# Patient Record
Sex: Female | Born: 1955 | Race: White | Hispanic: No | Marital: Married | Smoking: Never smoker
Health system: Southern US, Community
[De-identification: ages and names within clinical notes are randomized; demographics above are authoritative.]

## PROBLEM LIST (undated history)

## (undated) DIAGNOSIS — I10 Essential (primary) hypertension: Secondary | ICD-10-CM

## (undated) DIAGNOSIS — R809 Proteinuria, unspecified: Secondary | ICD-10-CM

## (undated) DIAGNOSIS — E119 Type 2 diabetes mellitus without complications: Secondary | ICD-10-CM

## (undated) DIAGNOSIS — G63 Polyneuropathy in diseases classified elsewhere: Secondary | ICD-10-CM

## (undated) DIAGNOSIS — F411 Generalized anxiety disorder: Secondary | ICD-10-CM

## (undated) DIAGNOSIS — H25813 Combined forms of age-related cataract, bilateral: Secondary | ICD-10-CM

## (undated) DIAGNOSIS — K589 Irritable bowel syndrome without diarrhea: Secondary | ICD-10-CM

## (undated) HISTORY — PX: CHOLECYSTECTOMY: SHX55

## (undated) HISTORY — DX: Irritable bowel syndrome without diarrhea: K58.9

## (undated) HISTORY — DX: Type 2 diabetes mellitus without complications: E11.9

## (undated) HISTORY — DX: Generalized anxiety disorder: F41.1

## (undated) HISTORY — DX: Proteinuria, unspecified: R80.9

## (undated) HISTORY — PX: EYE SURGERY: SHX253

## (undated) HISTORY — DX: Essential (primary) hypertension: I10

## (undated) HISTORY — DX: Polyneuropathy in diseases classified elsewhere: G63

## (undated) HISTORY — DX: Combined forms of age-related cataract, bilateral: H25.813

---

## 2004-10-15 ENCOUNTER — Ambulatory Visit: Payer: Self-pay | Admitting: Family Medicine

## 2005-01-08 ENCOUNTER — Ambulatory Visit: Payer: Self-pay | Admitting: Gastroenterology

## 2006-04-15 ENCOUNTER — Ambulatory Visit: Payer: Self-pay | Admitting: Family Medicine

## 2006-09-08 ENCOUNTER — Ambulatory Visit: Payer: Self-pay | Admitting: Family Medicine

## 2009-12-07 ENCOUNTER — Ambulatory Visit: Payer: Self-pay | Admitting: Physician Assistant

## 2011-01-22 DIAGNOSIS — F411 Generalized anxiety disorder: Secondary | ICD-10-CM

## 2011-01-22 DIAGNOSIS — K589 Irritable bowel syndrome without diarrhea: Secondary | ICD-10-CM | POA: Insufficient documentation

## 2011-01-22 HISTORY — DX: Irritable bowel syndrome, unspecified: K58.9

## 2011-01-22 HISTORY — DX: Generalized anxiety disorder: F41.1

## 2011-12-06 ENCOUNTER — Emergency Department: Payer: Self-pay | Admitting: Emergency Medicine

## 2011-12-06 LAB — COMPREHENSIVE METABOLIC PANEL
Alkaline Phosphatase: 91 U/L (ref 50–136)
BUN: 22 mg/dL — ABNORMAL HIGH (ref 7–18)
Bilirubin,Total: 0.4 mg/dL (ref 0.2–1.0)
Calcium, Total: 9.4 mg/dL (ref 8.5–10.1)
Creatinine: 0.98 mg/dL (ref 0.60–1.30)
EGFR (African American): >60
EGFR (Non-African Amer.): >60
Glucose: 477 mg/dL — ABNORMAL HIGH (ref 65–99)
Osmolality: 294 (ref 275–301)
SGOT(AST): 18 U/L (ref 15–37)
SGPT (ALT): 17 U/L
Sodium: 135 mmol/L — ABNORMAL LOW (ref 136–145)

## 2011-12-06 LAB — CBC
HCT: 38.6 % (ref 35.0–47.0)
HGB: 12.8 g/dL (ref 12.0–16.0)
MCH: 29.2 pg (ref 26.0–34.0)
MCHC: 33.2 g/dL (ref 32.0–36.0)
MCV: 88 fL (ref 80–100)
Platelet: 211 10*3/uL (ref 150–440)
RDW: 12.6 % (ref 11.5–14.5)
WBC: 12.1 10*3/uL — ABNORMAL HIGH (ref 3.6–11.0)

## 2011-12-06 LAB — TROPONIN I: Troponin-I: <0.02 ng/mL

## 2011-12-06 LAB — LIPASE, BLOOD: Lipase: 299 U/L (ref 73–393)

## 2011-12-07 LAB — URINALYSIS, COMPLETE
Bilirubin,UR: NEGATIVE
Glucose,UR: >=500 mg/dL (ref 0–75)
Ph: 6 (ref 4.5–8.0)
Protein: NEGATIVE
RBC,UR: 3 /HPF (ref 0–5)
Specific Gravity: 1.021 (ref 1.003–1.030)
Squamous Epithelial: <1

## 2011-12-10 ENCOUNTER — Ambulatory Visit: Payer: Self-pay | Admitting: Family Medicine

## 2013-05-17 ENCOUNTER — Ambulatory Visit: Payer: Self-pay | Admitting: Family

## 2013-05-17 LAB — URINALYSIS, COMPLETE
Bacteria: NEGATIVE
Bilirubin,UR: NEGATIVE
Ketone: NEGATIVE
Leukocyte Esterase: NEGATIVE
Nitrite: NEGATIVE
Protein: NEGATIVE
Specific Gravity: 1.01 (ref 1.003–1.030)
WBC UR: NONE SEEN /HPF (ref 0–5)

## 2013-05-18 ENCOUNTER — Ambulatory Visit: Payer: Self-pay | Admitting: Family

## 2013-05-18 LAB — CBC WITH DIFFERENTIAL/PLATELET
Eosinophil #: 0.1 10*3/uL (ref 0.0–0.7)
Eosinophil %: 2.7 %
HCT: 35.9 % (ref 35.0–47.0)
HGB: 11.9 g/dL — ABNORMAL LOW (ref 12.0–16.0)
Lymphocyte #: 2.2 10*3/uL (ref 1.0–3.6)
Lymphocyte %: 40.7 %
MCH: 29.2 pg (ref 26.0–34.0)
Monocyte #: 0.4 x10 3/mm (ref 0.2–0.9)
Monocyte %: 7.3 %
Neutrophil #: 2.6 10*3/uL (ref 1.4–6.5)
Neutrophil %: 48.5 %
Platelet: 223 10*3/uL (ref 150–440)
RBC: 4.07 10*6/uL (ref 3.80–5.20)

## 2013-05-18 LAB — HEPATIC FUNCTION PANEL A (ARMC)
Bilirubin,Total: 0.3 mg/dL (ref 0.2–1.0)
Total Protein: 7.4 g/dL (ref 6.4–8.2)

## 2013-05-18 LAB — BASIC METABOLIC PANEL
BUN: 14 mg/dL (ref 7–18)
Calcium, Total: 8.9 mg/dL (ref 8.5–10.1)
Chloride: 100 mmol/L (ref 98–107)
Co2: 29 mmol/L (ref 21–32)
Creatinine: 0.75 mg/dL (ref 0.60–1.30)
EGFR (Non-African Amer.): >60
Potassium: 4.1 mmol/L (ref 3.5–5.1)

## 2013-05-18 LAB — TSH: Thyroid Stimulating Horm: 6.89 u[IU]/mL — ABNORMAL HIGH

## 2013-05-18 LAB — SEDIMENTATION RATE: Erythrocyte Sed Rate: 31 mm/hr — ABNORMAL HIGH (ref 0–30)

## 2013-05-23 LAB — CLOSTRIDIUM DIFFICILE(ARMC)

## 2013-05-25 LAB — STOOL CULTURE

## 2013-08-30 DIAGNOSIS — I10 Essential (primary) hypertension: Secondary | ICD-10-CM | POA: Insufficient documentation

## 2013-08-30 HISTORY — DX: Essential (primary) hypertension: I10

## 2014-03-30 DIAGNOSIS — H25813 Combined forms of age-related cataract, bilateral: Secondary | ICD-10-CM | POA: Insufficient documentation

## 2014-03-30 HISTORY — DX: Combined forms of age-related cataract, bilateral: H25.813

## 2015-02-16 ENCOUNTER — Encounter: Payer: Self-pay | Admitting: *Deleted

## 2015-02-16 ENCOUNTER — Ambulatory Visit
Admission: EM | Admit: 2015-02-16 | Discharge: 2015-02-16 | Disposition: A | Discharge status: Home / Self Care | Payer: BC Managed Care – PPO | Attending: Internal Medicine | Admitting: Internal Medicine

## 2015-02-16 DIAGNOSIS — J4531 Mild persistent asthma with (acute) exacerbation: Secondary | ICD-10-CM

## 2015-02-16 HISTORY — DX: Type 2 diabetes mellitus without complications: E11.9

## 2015-02-16 HISTORY — DX: Essential (primary) hypertension: I10

## 2015-02-16 MED ORDER — BENZONATATE 200 MG PO CAPS: 200.0000 mg | ORAL_CAPSULE | Freq: Three times a day (TID) | ORAL | Status: DC | PRN

## 2015-02-16 MED ORDER — PREDNISONE 50 MG PO TABS: 50.0000 mg | ORAL_TABLET | Freq: Every day | ORAL | Status: DC

## 2015-02-16 MED ORDER — ALBUTEROL SULFATE HFA 108 (90 BASE) MCG/ACT IN AERS: 1.0000 | INHALATION_SPRAY | Freq: Four times a day (QID) | RESPIRATORY_TRACT | Status: DC | PRN

## 2015-02-16 NOTE — Discharge Instructions (Signed)
Sinus drainage can contribute; zyrtec or claritin otc may be helpful, as may a nasal steroid spray (nasacort, flonase).  Prescriptions for albuterol inhaler (to help calm airways), tessalon perles (to try to help with cough) and a burst of prednisone (3 days), were sent to the [facility] in [place].  Bronchospasm A bronchospasm is a spasm or tightening of the airways going into the lungs. During a bronchospasm breathing becomes more difficult because the airways get smaller. When this happens there can be coughing, a whistling sound when breathing (wheezing), and difficulty breathing. Bronchospasm is often associated with asthma, but not all patients who experience a bronchospasm have asthma. CAUSES  A bronchospasm is caused by inflammation or irritation of the airways. The inflammation or irritation may be triggered by:   Allergies (such as to animals, pollen, food, or mold). Allergens that cause bronchospasm may cause wheezing immediately after exposure or many hours later.   Infection. Viral infections are believed to be the most common cause of bronchospasm.   Exercise.   Irritants (such as pollution, cigarette smoke, strong odors, aerosol sprays, and paint fumes).   Weather changes. Winds increase molds and pollens in the air. Rain refreshes the air by washing irritants out. Cold air may cause inflammation.   Stress and emotional upset.  SIGNS AND SYMPTOMS   Wheezing.   Excessive nighttime coughing.   Frequent or severe coughing with a simple cold.   Chest tightness.   Shortness of breath.  DIAGNOSIS  Bronchospasm is usually diagnosed through a history and physical exam. Tests, such as chest X-rays, are sometimes done to look for other conditions. TREATMENT   Inhaled medicines can be given to open up your airways and help you breathe. The medicines can be given using either an inhaler or a nebulizer machine.  Corticosteroid medicines may be given for severe  bronchospasm, usually when it is associated with asthma. HOME CARE INSTRUCTIONS   Always have a plan prepared for seeking medical care. Know when to call your health care provider and local emergency services (911 in the [place] Know where you can access local emergency care.  Only take medicines as directed by your health care provider.  If you were prescribed an inhaler or nebulizer machine, ask your health care provider to explain how to use it correctly. Always use a spacer with your inhaler if you were given one.  It is necessary to remain calm during an attack. Try to relax and breathe more slowly.  Control your home environment in the following ways:   Change your heating and air conditioning filter at least once a month.   Limit your use of fireplaces and wood stoves.  Do not smoke and do not allow smoking in your home.   Avoid exposure to perfumes and fragrances.   Get rid of pests (such as roaches and mice) and their droppings.   Throw away plants if you see mold on them.   Keep your house clean and dust free.   Replace carpet with wood, tile, or vinyl flooring. Carpet can trap dander and dust.   Use allergy-proof pillows, mattress covers, and box spring covers.   Wash bed sheets and blankets every week in hot water and dry them in a dryer.   Use blankets that are made of polyester or cotton.   Wash hands frequently. SEEK MEDICAL CARE IF:   You have muscle aches.   You have chest pain.   The sputum changes from clear or white to yellow, green,  gray, or bloody.   The sputum you cough up gets thicker.   There are problems that may be related to the medicine you are given, such as a rash, itching, swelling, or trouble breathing.  SEEK IMMEDIATE MEDICAL CARE IF:   You have worsening wheezing and coughing even after taking your prescribed medicines.   You have increased difficulty breathing.   You develop severe chest pain. MAKE SURE YOU:     Understand these instructions.  Will watch your condition.  Will get help right away if you are not doing well or get worse. Document Released: 05/30/2003 Document Revised: 06/01/2013 Document Reviewed: 11/16/2012 [facility] Patient Information 2015 [place], [place]. This information is not intended to replace advice given to you by your health care provider. Make sure you discuss any questions you have with your health care provider.

## 2015-02-16 NOTE — ED Notes (Signed)
Pt states that she has had a cough for a couple of weeks and is having leg swelling in the left ankle.

## 2015-02-16 NOTE — ED Provider Notes (Signed)
CSN: [id number]     Arrival date & time 02/16/15  1727 History   First MD Initiated Contact with Patient 02/16/15 1906     Chief Complaint  Patient presents with  . Cough  . Leg Swelling   Patient is a 59 y.o. female presenting with cough.  Cough  patient is been coughing for a couple weeks, was a little worse about a week ago, now is better again but has not completely subsided. Mostly dry. Has a chronic runny nose, hasn't really noticed any change in this. No sore throat. No fever. 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 No difficulty with indigestion, reflux, sour stomach. Headache is chronic, no real change. In the last several weeks, feels like she has had slightly asymmetric leg swelling at the end of the day, usually feels like it is symmetric, not seems to be slightly worse on the left. Subsides overnight, when she is in bed, but then re-accumulates if she is up and around the next day.  Past Medical History  Diagnosis Date  . Diabetes mellitus without complication   . Hypertension    Past Surgical History  Procedure Laterality Date  . Cholecystectomy    . Eye surgery     Family history: No venous thromboembolic disease Social History  Substance Use Topics  . Smoking status: Never Smoker   . Smokeless tobacco: None  . Alcohol Use: No    Review of Systems  Respiratory: Positive for cough.   All other systems reviewed and are negative.   Allergies  Review of patient's allergies indicates no known allergies.  Home Medications   Prior to Admission medications   Medication Sig Start Date End Date Taking? Authorizing Provider  gabapentin (NEURONTIN) 100 MG capsule Take 100 mg by mouth 3 (three) times daily.   Yes Historical Provider, MD  insulin glargine (LANTUS) 100 UNIT/ML injection Inject 28 Units into the skin at bedtime.   Yes Historical Provider, MD  lisinopril (PRINIVIL,ZESTRIL) 10 MG tablet Take 10 mg by mouth daily.   Yes Historical Provider,  MD  lisinopril (PRINIVIL,ZESTRIL) 20 MG tablet Take 20 mg by mouth daily.   Yes Historical Provider, MD  metFORMIN (GLUCOPHAGE) 500 MG tablet Take 500 mg by mouth daily with breakfast.   Yes Historical Provider, MD                         BP 183/63 mmHg  Pulse 88  Temp(Src) 98.1 F (36.7 C) (Oral)  Ht 5\' 2"  (1.575 m)  Wt 155 lb (70.308 kg)  BMI 28.34 kg/m2  SpO2 98%   Physical Exam  Constitutional: She is oriented to person, place, and time. No distress.  Alert, nicely groomed  HENT:  Head: Atraumatic.  Bilateral tympanic membranes are moderately dull, no erythema Marked nasal congestion bilaterally, essentially occluded Throat is slightly red, voice is a little scratchy  Eyes:  Conjugate gaze, no eye redness/drainage  Neck: Neck supple.  Cardiovascular: Normal rate and regular rhythm.   Pulmonary/Chest: No respiratory distress. She has no wheezes. She has no rales.  Lungs clear, symmetric breath sounds Coughs with deep breath; patient has several coughing spells during the exam.  Abdominal: She exhibits no distension.  Musculoskeletal:  Distal foot and left ankle, around the straps of her sandal, are slightly puffy. Measurements are as follows: Left calf 15.5 inches, right calf 15.25 inches. Left ankle 10 inches, right ankle 10 inches.  Neurological: She is alert and  oriented to person, place, and time.  Skin: Skin is warm and dry.  Pink. Maybe slightly pale. No cyanosis  Nursing note and vitals reviewed.   ED Course  Procedures none  MDM   1. Bronchitis, allergic, mild persistent, with acute exacerbation    Discharge Medication List as of 02/16/2015  7:24 PM    START taking these medications   Details  albuterol (PROVENTIL HFA;VENTOLIN HFA) 108 (90 BASE) MCG/ACT inhaler Inhale 1-2 puffs into the lungs every 6 (six) hours as needed for wheezing or shortness of breath., Starting 02/16/2015, Until Discontinued, Normal    benzonatate (TESSALON) 200 MG capsule Take 1  capsule (200 mg total) by mouth 3 (three) times daily as needed for cough., Starting 02/16/2015, Until Discontinued, Normal    predniSONE (DELTASONE) 50 MG tablet Take 1 tablet (50 mg total) by mouth daily., Starting 02/16/2015, Until Discontinued, Normal       Recheck if not improving in several days, or for new temperature greater than 100.5. Anticipate gradual improvement in cough over the next 2-3 weeks.   Sherlene Shams, MD 02/16/15 2033

## 2015-08-15 DIAGNOSIS — E119 Type 2 diabetes mellitus without complications: Secondary | ICD-10-CM | POA: Insufficient documentation

## 2015-08-15 DIAGNOSIS — G63 Polyneuropathy in diseases classified elsewhere: Secondary | ICD-10-CM

## 2015-08-15 DIAGNOSIS — R809 Proteinuria, unspecified: Secondary | ICD-10-CM | POA: Insufficient documentation

## 2015-08-15 HISTORY — DX: Proteinuria, unspecified: R80.9

## 2015-08-15 HISTORY — DX: Type 2 diabetes mellitus without complications: E11.9

## 2015-08-15 HISTORY — DX: Polyneuropathy in diseases classified elsewhere: G63

## 2016-02-08 ENCOUNTER — Ambulatory Visit
Admission: EM | Admit: 2016-02-08 | Discharge: 2016-02-08 | Disposition: A | Discharge status: Home / Self Care | Payer: BC Managed Care – PPO | Attending: Family Medicine | Admitting: Family Medicine

## 2016-02-08 DIAGNOSIS — S161XXA Strain of muscle, fascia and tendon at neck level, initial encounter: Secondary | ICD-10-CM | POA: Diagnosis not present

## 2016-02-08 DIAGNOSIS — R42 Dizziness and giddiness: Secondary | ICD-10-CM | POA: Diagnosis not present
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
## 2016-03-07 ENCOUNTER — Other Ambulatory Visit: Payer: Self-pay | Admitting: Family Medicine

## 2016-03-07 DIAGNOSIS — N6320 Unspecified lump in the left breast, unspecified quadrant: Principal | ICD-10-CM

## 2016-03-07 DIAGNOSIS — Z1231 Encounter for screening mammogram for malignant neoplasm of breast: Secondary | ICD-10-CM

## 2016-03-07 DIAGNOSIS — N6310 Unspecified lump in the right breast, unspecified quadrant: Secondary | ICD-10-CM

## 2016-03-19 ENCOUNTER — Ambulatory Visit
Admission: RE | Admit: 2016-03-19 | Discharge: 2016-03-19 | Disposition: A | Discharge status: Home / Self Care | Payer: BC Managed Care – PPO | Source: Ambulatory Visit | Attending: Family Medicine | Admitting: Family Medicine

## 2016-03-19 ENCOUNTER — Encounter: Payer: Self-pay | Admitting: Radiology

## 2016-03-19 DIAGNOSIS — N6320 Unspecified lump in the left breast, unspecified quadrant: Principal | ICD-10-CM

## 2016-03-19 DIAGNOSIS — N6310 Unspecified lump in the right breast, unspecified quadrant: Secondary | ICD-10-CM | POA: Insufficient documentation

## 2016-07-09 ENCOUNTER — Ambulatory Visit (INDEPENDENT_AMBULATORY_CARE_PROVIDER_SITE_OTHER): Payer: BC Managed Care – PPO | Admitting: Gastroenterology

## 2016-07-09 ENCOUNTER — Other Ambulatory Visit: Payer: Self-pay

## 2016-07-09 ENCOUNTER — Encounter: Payer: Self-pay | Admitting: Gastroenterology

## 2016-07-09 VITALS — BP 155/74 | HR 86 | Temp 99.1°F | Ht 62.0 in | Wt 150.0 lb

## 2016-07-09 DIAGNOSIS — R197 Diarrhea, unspecified: Secondary | ICD-10-CM | POA: Diagnosis not present

## 2016-07-09 DIAGNOSIS — R1084 Generalized abdominal pain: Secondary | ICD-10-CM | POA: Diagnosis not present

## 2016-07-09 NOTE — Progress Notes (Signed)
Gastroenterology Consultation  Referring Provider:     Rudi Coco, MD Primary Care Physician:  WHITE, Jarome Matin, MD Primary Gastroenterologist:  Dr. Allen Norris     Reason for Consultation:     Diarrhea        HPI:   Kim Stevens is a 61 y.o. y/o female referred for consultation & management of Diarrhea by Dr. Dema Severin, Jarome Matin, MD.  This patient comes in today with a report of many years of diarrhea. The patient reports that about 5 years ago she was admitted to the hospital and found to have colitis. The patient reports the colitis was told her to possibly be an infection because she presented with nausea vomiting or rectal bleeding. The patient has no further rectal bleeding but she has intermittent diarrhea which happens every week to every other week. The patient states the diarrhea is so bad that she has soiled herself while sleeping. She has also fallen out of bed while trying to rush to the bathroom. The patient states that she is reluctant to go to the beach or any place where she is not close to a bathroom. This has resulted in her not going out to dinner with her family. The patient also reports that she has generalized abdominal pain. When the diarrhea is not present the patient states that her stools are soft and pasty but she does not have overt constipation. She does not have a bowel movement for 4-5 days after these bouts of diarrhea. She also states that she has lost approximate 5 pounds when this happens. The patient has a family history of colon cancer in her mother at a young age and she thinks her mother was around 48 years old. The patient thinks her last colonoscopy was 4 years ago.  Past Medical History:  Diagnosis Date  . Anxiety state 01/22/2011  . Combined forms of age-related cataract of both eyes 03/30/2014  . Diabetes mellitus without complication ([place])   . HTN (hypertension) 08/30/2013  . Hypertension   . Irritable bowel syndrome 01/22/2011  . Microalbuminuria  08/15/2015  . Polyneuropathy associated with underlying disease ([place]) 08/15/2015   Overview:  Gabapentin qhs  . Type 2 diabetes mellitus not at goal [facility]) 08/15/2015   Overview:  Can no longer go to [facility]. Referred to [facility] 3/17    Past Surgical History:  Procedure Laterality Date  . CHOLECYSTECTOMY    . EYE SURGERY      Prior to Admission medications   Medication Sig Start Date End Date Taking? Authorizing Provider  insulin glargine (LANTUS) 100 UNIT/ML injection Inject 28 Units into the skin at bedtime.   Yes Historical Provider, MD  albuterol (PROVENTIL HFA;VENTOLIN HFA) 108 (90 BASE) MCG/ACT inhaler Inhale 1-2 puffs into the lungs every 6 (six) hours as needed for wheezing or shortness of breath. Patient not taking: Reported on 07/09/2016 02/16/15   Sherlene Shams, MD  amLODipine (NORVASC) 10 MG tablet Take by mouth. 08/22/15 08/21/16  Historical Provider, MD  aspirin 81 MG chewable tablet Chew by mouth.    Historical Provider, MD  atorvastatin (LIPITOR) 40 MG tablet Take by mouth. 08/15/15 08/14/16  Historical Provider, MD  benzonatate (TESSALON) 200 MG capsule Take 1 capsule (200 mg total) by mouth 3 (three) times daily as needed for cough. Patient not taking: Reported on 07/09/2016 02/16/15   Sherlene Shams, MD  Blood Glucose Monitoring Suppl (FIFTY50 GLUCOSE METER 2.0) w/Device KIT Use as directed. SN #[id number], LOT #-[id number] X  and Exp-08/2015 06/21/14   Historical Provider, MD  gabapentin (NEURONTIN) 100 MG capsule Take 300 mg by mouth 2 (two) times daily.     Historical Provider, MD  insulin regular (NOVOLIN R,HUMULIN R) 250 units/2.18m (100 units/mL) injection Inject into the skin. 08/22/15   Historical Provider, MD  lisinopril (PRINIVIL,ZESTRIL) 10 MG tablet Take 10 mg by mouth daily.    Historical Provider, MD  lisinopril (PRINIVIL,ZESTRIL) 20 MG tablet Take 20 mg by mouth daily.    Historical Provider, MD  lisinopril-hydrochlorothiazide (PRINZIDE,ZESTORETIC) 20-25 MG tablet Take by  mouth. 08/15/15 08/14/16  Historical Provider, MD  metFORMIN (GLUCOPHAGE) 500 MG tablet Take 500 mg by mouth daily with breakfast.    Historical Provider, MD  nepafenac (NEVANAC) 0.1 % ophthalmic suspension  12/08/14   Historical Provider, MD  predniSONE (DELTASONE) 50 MG tablet Take 1 tablet (50 mg total) by mouth daily. Patient not taking: Reported on 07/09/2016 02/16/15   LSherlene Shams MD    Family History  Problem Relation Age of Onset  . Cancer Mother   . Diabetes Father   . Stroke Maternal Grandmother      Social History  Substance Use Topics  . Smoking status: Never Smoker  . Smokeless tobacco: Never Used  . Alcohol use Yes     Comment: occasionally    Allergies as of 07/09/2016 - Review Complete 07/09/2016  Allergen Reaction Noted  . Meperidine  12/16/2011  . Oxycodone-acetaminophen  04/15/2011    Review of Systems:    All systems reviewed and negative except where noted in HPI.   Physical Exam:  BP (!) 155/74   Pulse 86   Temp 99.1 F (37.3 C) (Oral)   Ht 5' 2"  (1.575 m)   Wt 150 lb (68 kg)   BMI 27.44 kg/m  No LMP recorded. Patient is postmenopausal. Psych:  Alert and cooperative. Normal mood and affect. General:   Alert,  Well-developed, well-nourished, pleasant and cooperative in NAD Head:  Normocephalic and atraumatic. Eyes:  Sclera clear, no icterus.   Conjunctiva pink. Ears:  Normal auditory acuity. Nose:  No deformity, discharge, or lesions. Mouth:  No deformity or lesions,oropharynx pink & moist. Neck:  Supple; no masses or thyromegaly. Lungs:  Respirations even and unlabored.  Clear throughout to auscultation.   No wheezes, crackles, or rhonchi. No acute distress. Heart:  Regular rate and rhythm; no murmurs, clicks, rubs, or gallops. Abdomen:  Normal bowel sounds.  No bruits.  Soft, non-tender and non-distended without masses, hepatosplenomegaly or hernias noted.  No guarding or rebound tenderness.  Negative Carnett sign.   Rectal:  Deferred.  Msk:   Symmetrical without gross deformities.  Good, equal movement & strength bilaterally. Pulses:  Normal pulses noted. Extremities:  No clubbing or edema.  No cyanosis. Neurologic:  Alert and oriented x3;  grossly normal neurologically. Skin:  Intact without significant lesions or rashes.  No jaundice. Lymph Nodes:  No significant cervical adenopathy. Psych:  Alert and cooperative. Normal mood and affect.  Imaging Studies: No results found.  Assessment and Plan:   Kim WILLISONis a 61y.o. y/o female who has a history of diarrhea but it is now getting worse with the patient soiling herself at night. The patient also reports some intermittent weight loss when she has the symptoms. The patient will be set up for colonoscopy for possible microscopic colitis. She also states that she has been tested for celiac sprue in the past and states that she was told she may be  sensitive to wheat. The patient reports that she has avoided it for some time. The patient also reports that she eats cheese and drinks very little if any at all. The patient will try to see if her symptoms are worse when she has dairy products. The patient has been explained the plan and agrees with it.    Lucilla Lame, MD. Marval Regal   Note: This dictation was prepared with Dragon dictation along with smaller phrase technology. Any transcriptional errors that result from this process are unintentional.

## 2016-07-11 ENCOUNTER — Encounter: Payer: Self-pay | Admitting: *Deleted

## 2016-07-12 ENCOUNTER — Telehealth: Payer: Self-pay | Admitting: Gastroenterology

## 2016-07-12 ENCOUNTER — Other Ambulatory Visit: Payer: Self-pay

## 2016-07-12 MED ORDER — PEG 3350-KCL-NABCB-NACL-NASULF 236 G PO SOLR: ORAL | 0 refills | Status: DC

## 2016-07-12 NOTE — Telephone Encounter (Signed)
Patients colonoscopy is Monday and her RX for supprep hasn't been called in yet [facility]

## 2016-07-12 NOTE — Telephone Encounter (Signed)
Rx sent to [facility] pharmacy.  

## 2016-07-15 ENCOUNTER — Ambulatory Visit: Payer: BC Managed Care – PPO | Admitting: Anesthesiology

## 2016-07-15 ENCOUNTER — Encounter
Admission: RE | Disposition: A | Discharge status: Home / Self Care | Payer: Self-pay | Source: Ambulatory Visit | Attending: Gastroenterology

## 2016-07-15 ENCOUNTER — Ambulatory Visit
Admission: RE | Admit: 2016-07-15 | Discharge: 2016-07-15 | Disposition: A | Discharge status: Home / Self Care | Payer: BC Managed Care – PPO | Source: Ambulatory Visit | Attending: Gastroenterology | Admitting: Gastroenterology

## 2016-07-15 DIAGNOSIS — D123 Benign neoplasm of transverse colon: Secondary | ICD-10-CM | POA: Diagnosis not present

## 2016-07-15 DIAGNOSIS — Z794 Long term (current) use of insulin: Secondary | ICD-10-CM | POA: Insufficient documentation

## 2016-07-15 DIAGNOSIS — I10 Essential (primary) hypertension: Secondary | ICD-10-CM | POA: Diagnosis not present

## 2016-07-15 DIAGNOSIS — E119 Type 2 diabetes mellitus without complications: Secondary | ICD-10-CM | POA: Diagnosis not present

## 2016-07-15 DIAGNOSIS — Z79899 Other long term (current) drug therapy: Secondary | ICD-10-CM | POA: Insufficient documentation

## 2016-07-15 DIAGNOSIS — K640 First degree hemorrhoids: Secondary | ICD-10-CM | POA: Insufficient documentation

## 2016-07-15 DIAGNOSIS — G629 Polyneuropathy, unspecified: Secondary | ICD-10-CM | POA: Insufficient documentation

## 2016-07-15 DIAGNOSIS — K529 Noninfective gastroenteritis and colitis, unspecified: Secondary | ICD-10-CM | POA: Insufficient documentation

## 2016-07-15 DIAGNOSIS — F419 Anxiety disorder, unspecified: Secondary | ICD-10-CM | POA: Diagnosis not present

## 2016-07-15 DIAGNOSIS — D122 Benign neoplasm of ascending colon: Secondary | ICD-10-CM | POA: Diagnosis not present

## 2016-07-15 HISTORY — PX: POLYPECTOMY: SHX5525

## 2016-07-15 HISTORY — PX: COLONOSCOPY WITH PROPOFOL: SHX5780

## 2016-07-15 SURGERY — COLONOSCOPY WITH PROPOFOL
Anesthesia: Monitor Anesthesia Care | Wound class: Contaminated

## 2016-07-15 MED ORDER — LACTATED RINGERS IV SOLN
INTRAVENOUS | Status: DC
  Administered 2016-07-15: 10:00:00 via INTRAVENOUS

## 2016-07-15 MED ORDER — LIDOCAINE HCL (CARDIAC) 20 MG/ML IV SOLN
INTRAVENOUS | Status: DC | PRN
  Administered 2016-07-15: 20 mg via INTRAVENOUS

## 2016-07-15 MED ORDER — SIMETHICONE 40 MG/0.6ML PO SUSP
ORAL | Status: DC | PRN
  Administered 2016-07-15: 10:00:00

## 2016-07-15 MED ORDER — PROPOFOL 10 MG/ML IV BOLUS
INTRAVENOUS | Status: DC | PRN
  Administered 2016-07-15: 60 mg via INTRAVENOUS
  Administered 2016-07-15 (×2): 30 mg via INTRAVENOUS
  Administered 2016-07-15: 60 mg via INTRAVENOUS
  Administered 2016-07-15: 30 mg via INTRAVENOUS
  Administered 2016-07-15: 60 mg via INTRAVENOUS
  Administered 2016-07-15: 20 mg via INTRAVENOUS

## 2016-07-15 SURGICAL SUPPLY — 23 items
CANISTER SUCT 1200ML W/VALVE (MISCELLANEOUS) ×3 IMPLANT
CLIP HMST 235XBRD CATH ROT (MISCELLANEOUS) IMPLANT
CLIP RESOLUTION 360 11X235 (MISCELLANEOUS)
FCP ESCP3.2XJMB 240X2.8X (MISCELLANEOUS)
FORCEPS BIOP RAD 4 LRG CAP 4 (CUTTING FORCEPS) ×3 IMPLANT
FORCEPS BIOP RJ4 240 W/NDL (MISCELLANEOUS)
FORCEPS ESCP3.2XJMB 240X2.8X (MISCELLANEOUS) IMPLANT
GOWN CVR UNV OPN BCK APRN NK (MISCELLANEOUS) ×4 IMPLANT
GOWN ISOL THUMB LOOP REG UNIV (MISCELLANEOUS) ×2
INJECTOR VARIJECT VIN23 (MISCELLANEOUS) IMPLANT
KIT DEFENDO VALVE AND CONN (KITS) IMPLANT
KIT ENDO PROCEDURE OLY (KITS) ×3 IMPLANT
MARKER SPOT ENDO TATTOO 5ML (MISCELLANEOUS) IMPLANT
PAD GROUND ADULT SPLIT (MISCELLANEOUS) IMPLANT
PROBE APC STR FIRE (PROBE) IMPLANT
RETRIEVER NET ROTH 2.5X230 LF (MISCELLANEOUS) IMPLANT
SNARE SHORT THROW 13M SML OVAL (MISCELLANEOUS) ×3 IMPLANT
SNARE SHORT THROW 30M LRG OVAL (MISCELLANEOUS) IMPLANT
SNARE SNG USE RND 15MM (INSTRUMENTS) IMPLANT
SPOT EX ENDOSCOPIC TATTOO (MISCELLANEOUS)
TRAP ETRAP POLY (MISCELLANEOUS) ×3 IMPLANT
VARIJECT INJECTOR VIN23 (MISCELLANEOUS)
WATER STERILE IRR 250ML POUR (IV SOLUTION) ×3 IMPLANT

## 2016-07-15 NOTE — Anesthesia Procedure Notes (Signed)
Procedure Name: MAC Performed by: Lind Guest Pre-anesthesia Checklist: Patient identified, Emergency Drugs available, Suction available, Patient being monitored and Timeout performed Patient Re-evaluated:Patient Re-evaluated prior to inductionOxygen Delivery Method: Nasal cannula

## 2016-07-15 NOTE — Anesthesia Postprocedure Evaluation (Signed)
Anesthesia Post Note  Patient: Kim Stevens  Procedure(s) Performed: Procedure(s) (LRB): COLONOSCOPY WITH PROPOFOL (N/A) POLYPECTOMY  Patient location during evaluation: PACU Anesthesia Type: MAC Level of consciousness: awake and alert Pain management: pain level controlled Vital Signs Assessment: post-procedure vital signs reviewed and stable Respiratory status: spontaneous breathing, nonlabored ventilation, respiratory function stable and patient connected to nasal cannula oxygen Cardiovascular status: stable and blood pressure returned to baseline Anesthetic complications: no    Alisa Graff

## 2016-07-15 NOTE — Op Note (Signed)
[facility] Gastroenterology Patient Name: Kim Stevens Procedure Date: 07/15/2016 9:58 AM MRN: [id number] Account #: [id number] Date of Birth: [birth year] Admit Type: Outpatient Age: 61 Room: [facility] OR ROOM 01 Gender: Female Note Status: Finalized Procedure:            Colonoscopy Indications:          Chronic diarrhea Providers:            Lucilla Lame MD, MD Referring MD:         Dani Gobble. White, MD (Referring MD) Medicines:            Propofol per Anesthesia Complications:        No immediate complications. Procedure:            Pre-Anesthesia Assessment:                       - Prior to the procedure, a History and Physical was                        performed, and patient medications and allergies were                        reviewed. The patient's tolerance of previous                        anesthesia was also reviewed. The risks and benefits of                        the procedure and the sedation options and risks were                        discussed with the patient. All questions were                        answered, and informed consent was obtained. Prior                        Anticoagulants: The patient has taken no previous                        anticoagulant or antiplatelet agents. ASA Grade                        Assessment: II - A patient with mild systemic disease.                        After reviewing the risks and benefits, the patient was                        deemed in satisfactory condition to undergo the                        procedure.                       After obtaining informed consent, the colonoscope was                        passed under direct vision. Throughout the procedure,  the patient's blood pressure, pulse, and oxygen                        saturations were monitored continuously. The [place] (S#: [id number]) was introduced through   the anus and advanced to the the terminal ileum. The                        colonoscopy was performed without difficulty. The                        patient tolerated the procedure well. The quality of                        the bowel preparation was good. Findings:      The perianal and digital rectal examinations were normal.      A 8 mm polyp was found in the transverse colon. The polyp was sessile.       The polyp was removed with a cold snare. Resection and retrieval were       complete.      A 4 mm polyp was found in the ascending colon. The polyp was sessile.       The polyp was removed with a cold biopsy forceps. Resection and       retrieval were complete.      The terminal ileum appeared normal. Biopsies were taken with a cold       forceps for histology.      Random biopsies were obtained with cold forceps for histology randomly       in the entire colon.      Non-bleeding internal hemorrhoids were found during retroflexion. The       hemorrhoids were Grade I (internal hemorrhoids that do not prolapse). Impression:           - One 8 mm polyp in the transverse colon, removed with                        a cold snare. Resected and retrieved.                       - One 4 mm polyp in the ascending colon, removed with a                        cold biopsy forceps. Resected and retrieved.                       - The examined portion of the ileum was normal.                        Biopsied.                       - Non-bleeding internal hemorrhoids.                       - Random biopsies were obtained in the entire colon. Recommendation:       - Discharge patient to home.                       -  Resume previous diet.                       - Continue present medications.                       - Repeat colonoscopy in 5 years if polyp adenoma and 10                        years if hyperplastic Procedure Code(s):    --- Professional ---                       [phone], Colonoscopy,  flexible; with removal of tumor(s),                        polyp(s), or other lesion(s) by snare technique                       45380, 56, Colonoscopy, flexible; with biopsy, single                        or multiple Diagnosis Code(s):    --- Professional ---                       K52.9, Noninfective gastroenteritis and colitis,                        unspecified                       D12.3, Benign neoplasm of transverse colon (hepatic                        flexure or splenic flexure)                       D12.2, Benign neoplasm of ascending colon CPT copyright 2016 American Medical Association. All rights reserved. The codes documented in this report are preliminary and upon coder review may  be revised to meet current compliance requirements. Lucilla Lame MD, MD 07/15/2016 10:25:37 AM This report has been signed electronically. Number of Addenda: 0 Note Initiated On: 07/15/2016 9:58 AM Scope Withdrawal Time: 0 hours 10 minutes 47 seconds  Total Procedure Duration: 0 hours 15 minutes 11 seconds       [facility]

## 2016-07-15 NOTE — H&P (Signed)
Lucilla Lame, MD [address] [place], [place] [address] Phone: [phone] Fax : [phone]  Primary Care Physician:  WHITE, Jarome Matin, MD Primary Gastroenterologist:  Dr. Allen Norris  Pre-Procedure History & Physical: HPI:  Kim Stevens is a 61 y.o. female is here for an colonoscopy.   Past Medical History:  Diagnosis Date  . Anxiety state 01/22/2011  . Combined forms of age-related cataract of both eyes 03/30/2014  . Diabetes mellitus without complication ([place])   . HTN (hypertension) 08/30/2013  . Hypertension   . Irritable bowel syndrome 01/22/2011  . Microalbuminuria 08/15/2015  . Polyneuropathy associated with underlying disease ([place]) 08/15/2015   Overview:  Gabapentin qhs  . Type 2 diabetes mellitus not at goal [facility]) 08/15/2015   Overview:  Can no longer go to [facility]. Referred to [facility] 3/17    Past Surgical History:  Procedure Laterality Date  . CHOLECYSTECTOMY    . EYE SURGERY      Prior to Admission medications   Medication Sig Start Date End Date Taking? Authorizing Provider  Blood Glucose Monitoring Suppl (FIFTY50 GLUCOSE METER 2.0) w/Device KIT Use as directed. SN #[id number], LOT #-[id number] X and Exp-08/2015 06/21/14  Yes Historical Provider, MD  gabapentin (NEURONTIN) 100 MG capsule Take 300 mg by mouth 2 (two) times daily.    Yes Historical Provider, MD  insulin glargine (LANTUS) 100 UNIT/ML injection Inject 28 Units into the skin at bedtime.   Yes Historical Provider, MD  insulin regular (NOVOLIN R,HUMULIN R) 250 units/2.74m (100 units/mL) injection Inject into the skin. 08/22/15  Yes Historical Provider, MD  polyethylene glycol (GOLYTELY) 236 g solution Drink one 8 oz glass every 20 mins until stools are clear 07/12/16  Yes DLucilla Lame MD  albuterol (PROVENTIL HFA;VENTOLIN HFA) 108 (90 BASE) MCG/ACT inhaler Inhale 1-2 puffs into the lungs every 6 (six) hours as needed for wheezing or shortness of breath. Patient not taking: Reported on 07/09/2016 02/16/15    LSherlene Shams MD  amLODipine (NORVASC) 10 MG tablet Take by mouth. 08/22/15 08/21/16  Historical Provider, MD  aspirin 81 MG chewable tablet Chew by mouth.    Historical Provider, MD  atorvastatin (LIPITOR) 40 MG tablet Take by mouth. 08/15/15 08/14/16  Historical Provider, MD  benzonatate (TESSALON) 200 MG capsule Take 1 capsule (200 mg total) by mouth 3 (three) times daily as needed for cough. Patient not taking: Reported on 07/09/2016 02/16/15   LSherlene Shams MD  lisinopril (PRINIVIL,ZESTRIL) 10 MG tablet Take 10 mg by mouth daily.    Historical Provider, MD  lisinopril (PRINIVIL,ZESTRIL) 20 MG tablet Take 20 mg by mouth daily.    Historical Provider, MD  lisinopril-hydrochlorothiazide (PRINZIDE,ZESTORETIC) 20-25 MG tablet Take by mouth. 08/15/15 08/14/16  Historical Provider, MD  metFORMIN (GLUCOPHAGE) 500 MG tablet Take 500 mg by mouth daily with breakfast.    Historical Provider, MD  nepafenac (NEVANAC) 0.1 % ophthalmic suspension  12/08/14   Historical Provider, MD  predniSONE (DELTASONE) 50 MG tablet Take 1 tablet (50 mg total) by mouth daily. Patient not taking: Reported on 07/09/2016 02/16/15   LSherlene Shams MD    Allergies as of 07/09/2016 - Review Complete 07/09/2016  Allergen Reaction Noted  . Meperidine  12/16/2011  . Oxycodone-acetaminophen  04/15/2011    Family History  Problem Relation Age of Onset  . Cancer Mother   . Diabetes Father   . Stroke Maternal Grandmother     Social History   Social History  . Marital status: Married  Spouse name: N/A  . Number of children: N/A  . Years of education: N/A   Occupational History  . Not on file.   Social History Main Topics  . Smoking status: Never Smoker  . Smokeless tobacco: Never Used  . Alcohol use Yes     Comment: occasionally: 1-2 drinks/month  . Drug use: No  . Sexual activity: Not on file   Other Topics Concern  . Not on file   Social History Narrative  . No narrative on file    Review of Systems: See  HPI, otherwise negative ROS  Physical Exam: BP (!) 193/85   Pulse 90   Temp 98 F (36.7 C) (Temporal)   Resp 16   Ht 5' 2"  (1.575 m)   Wt 147 lb (66.7 kg)   SpO2 94%   BMI 26.89 kg/m  General:   Alert,  pleasant and cooperative in NAD Head:  Normocephalic and atraumatic. Neck:  Supple; no masses or thyromegaly. Lungs:  Clear throughout to auscultation.    Heart:  Regular rate and rhythm. Abdomen:  Soft, nontender and nondistended. Normal bowel sounds, without guarding, and without rebound.   Neurologic:  Alert and  oriented x4;  grossly normal neurologically.  Impression/Plan: Kim Stevens is here for an colonoscopy to be performed for diarrhea  Risks, benefits, limitations, and alternatives regarding  colonoscopy have been reviewed with the patient.  Questions have been answered.  All parties agreeable.   Lucilla Lame, MD  07/15/2016, 9:26 AM

## 2016-07-15 NOTE — Anesthesia Preprocedure Evaluation (Signed)
Anesthesia Evaluation  Patient identified by MRN, date of birth, ID band Patient awake    Reviewed: Allergy & Precautions, H&P , NPO status , Patient's Chart, lab work & pertinent test results, reviewed documented beta blocker date and time   Airway Mallampati: II  TM Distance: >3 FB Neck ROM: full    Dental no notable dental hx.    Pulmonary neg pulmonary ROS,    Pulmonary exam normal breath sounds clear to auscultation       Cardiovascular Exercise Tolerance: Good hypertension,  Rhythm:regular Rate:Normal     Neuro/Psych PSYCHIATRIC DISORDERS (anxiety) negative neurological ROS     GI/Hepatic Neg liver ROS, IBS   Endo/Other  diabetes, Type 2  Renal/GU negative Renal ROS  negative genitourinary   Musculoskeletal   Abdominal   Peds  Hematology negative hematology ROS (+)   Anesthesia Other Findings   Reproductive/Obstetrics negative OB ROS                             Anesthesia Physical Anesthesia Plan  ASA: II  Anesthesia Plan: MAC   Post-op Pain Management:    Induction:   Airway Management Planned:   Additional Equipment:   Intra-op Plan:   Post-operative Plan:   Informed Consent: I have reviewed the patients History and Physical, chart, labs and discussed the procedure including the risks, benefits and alternatives for the proposed anesthesia with the patient or authorized representative who has indicated his/her understanding and acceptance.   Dental Advisory Given  Plan Discussed with: CRNA  Anesthesia Plan Comments:         Anesthesia Quick Evaluation

## 2016-07-15 NOTE — Transfer of Care (Signed)
Immediate Anesthesia Transfer of Care Note  Patient: Kim Stevens  Procedure(s) Performed: Procedure(s) with comments: COLONOSCOPY WITH PROPOFOL (N/A) - Diabetic - insulin POLYPECTOMY  Patient Location: PACU  Anesthesia Type: MAC  Level of Consciousness: awake, alert  and patient cooperative  Airway and Oxygen Therapy: Patient Spontanous Breathing and Patient connected to supplemental oxygen  Post-op Assessment: Post-op Vital signs reviewed, Patient's Cardiovascular Status Stable, Respiratory Function Stable, Patent Airway and No signs of Nausea or vomiting  Post-op Vital Signs: Reviewed and stable  Complications: No apparent anesthesia complications

## 2016-07-16 ENCOUNTER — Encounter: Payer: Self-pay | Admitting: Gastroenterology

## 2016-07-18 ENCOUNTER — Encounter: Payer: Self-pay | Admitting: Gastroenterology

## 2016-07-22 ENCOUNTER — Telehealth: Payer: Self-pay | Admitting: Gastroenterology

## 2016-07-22 NOTE — Telephone Encounter (Signed)
results

## 2016-07-22 NOTE — Telephone Encounter (Signed)
Pt notified of colonoscopy results. Results letter mailed.

## 2017-05-27 ENCOUNTER — Inpatient Hospital Stay
Admission: EM | Admit: 2017-05-27 | Discharge: 2017-06-10 | DRG: 870 | Disposition: E | Payer: BC Managed Care – PPO | Attending: Internal Medicine | Admitting: Internal Medicine

## 2017-05-27 ENCOUNTER — Emergency Department: Payer: BC Managed Care – PPO

## 2017-05-27 DIAGNOSIS — Z888 Allergy status to other drugs, medicaments and biological substances status: Secondary | ICD-10-CM

## 2017-05-27 DIAGNOSIS — J9600 Acute respiratory failure, unspecified whether with hypoxia or hypercapnia: Secondary | ICD-10-CM | POA: Diagnosis not present

## 2017-05-27 DIAGNOSIS — E871 Hypo-osmolality and hyponatremia: Secondary | ICD-10-CM | POA: Diagnosis present

## 2017-05-27 DIAGNOSIS — I10 Essential (primary) hypertension: Secondary | ICD-10-CM | POA: Diagnosis present

## 2017-05-27 DIAGNOSIS — I129 Hypertensive chronic kidney disease with stage 1 through stage 4 chronic kidney disease, or unspecified chronic kidney disease: Secondary | ICD-10-CM | POA: Diagnosis present

## 2017-05-27 DIAGNOSIS — R402112 Coma scale, eyes open, never, at arrival to emergency department: Secondary | ICD-10-CM | POA: Diagnosis present

## 2017-05-27 DIAGNOSIS — E876 Hypokalemia: Secondary | ICD-10-CM | POA: Diagnosis not present

## 2017-05-27 DIAGNOSIS — Z452 Encounter for adjustment and management of vascular access device: Secondary | ICD-10-CM

## 2017-05-27 DIAGNOSIS — G9341 Metabolic encephalopathy: Secondary | ICD-10-CM | POA: Diagnosis not present

## 2017-05-27 DIAGNOSIS — E874 Mixed disorder of acid-base balance: Secondary | ICD-10-CM | POA: Diagnosis not present

## 2017-05-27 DIAGNOSIS — E1142 Type 2 diabetes mellitus with diabetic polyneuropathy: Secondary | ICD-10-CM | POA: Diagnosis present

## 2017-05-27 DIAGNOSIS — E785 Hyperlipidemia, unspecified: Secondary | ICD-10-CM | POA: Diagnosis present

## 2017-05-27 DIAGNOSIS — Z978 Presence of other specified devices: Secondary | ICD-10-CM

## 2017-05-27 DIAGNOSIS — E1122 Type 2 diabetes mellitus with diabetic chronic kidney disease: Secondary | ICD-10-CM | POA: Diagnosis present

## 2017-05-27 DIAGNOSIS — Z823 Family history of stroke: Secondary | ICD-10-CM

## 2017-05-27 DIAGNOSIS — Z7901 Long term (current) use of anticoagulants: Secondary | ICD-10-CM

## 2017-05-27 DIAGNOSIS — I429 Cardiomyopathy, unspecified: Secondary | ICD-10-CM | POA: Diagnosis present

## 2017-05-27 DIAGNOSIS — Z66 Do not resuscitate: Secondary | ICD-10-CM | POA: Diagnosis not present

## 2017-05-27 DIAGNOSIS — G4730 Sleep apnea, unspecified: Secondary | ICD-10-CM | POA: Diagnosis present

## 2017-05-27 DIAGNOSIS — I469 Cardiac arrest, cause unspecified: Secondary | ICD-10-CM | POA: Diagnosis present

## 2017-05-27 DIAGNOSIS — T8130XA Disruption of wound, unspecified, initial encounter: Secondary | ICD-10-CM | POA: Diagnosis not present

## 2017-05-27 DIAGNOSIS — R570 Cardiogenic shock: Secondary | ICD-10-CM | POA: Diagnosis present

## 2017-05-27 DIAGNOSIS — J9690 Respiratory failure, unspecified, unspecified whether with hypoxia or hypercapnia: Secondary | ICD-10-CM

## 2017-05-27 DIAGNOSIS — Z809 Family history of malignant neoplasm, unspecified: Secondary | ICD-10-CM

## 2017-05-27 DIAGNOSIS — J90 Pleural effusion, not elsewhere classified: Secondary | ICD-10-CM | POA: Diagnosis present

## 2017-05-27 DIAGNOSIS — G931 Anoxic brain damage, not elsewhere classified: Secondary | ICD-10-CM | POA: Diagnosis not present

## 2017-05-27 DIAGNOSIS — Z951 Presence of aortocoronary bypass graft: Secondary | ICD-10-CM

## 2017-05-27 DIAGNOSIS — Z515 Encounter for palliative care: Secondary | ICD-10-CM | POA: Diagnosis not present

## 2017-05-27 DIAGNOSIS — E1165 Type 2 diabetes mellitus with hyperglycemia: Secondary | ICD-10-CM | POA: Diagnosis not present

## 2017-05-27 DIAGNOSIS — Z885 Allergy status to narcotic agent status: Secondary | ICD-10-CM

## 2017-05-27 DIAGNOSIS — R402312 Coma scale, best motor response, none, at arrival to emergency department: Secondary | ICD-10-CM | POA: Diagnosis present

## 2017-05-27 DIAGNOSIS — N183 Chronic kidney disease, stage 3 unspecified: Secondary | ICD-10-CM

## 2017-05-27 DIAGNOSIS — Z833 Family history of diabetes mellitus: Secondary | ICD-10-CM

## 2017-05-27 DIAGNOSIS — D638 Anemia in other chronic diseases classified elsewhere: Secondary | ICD-10-CM | POA: Diagnosis present

## 2017-05-27 DIAGNOSIS — I2510 Atherosclerotic heart disease of native coronary artery without angina pectoris: Secondary | ICD-10-CM | POA: Diagnosis present

## 2017-05-27 DIAGNOSIS — R402212 Coma scale, best verbal response, none, at arrival to emergency department: Secondary | ICD-10-CM | POA: Diagnosis present

## 2017-05-27 DIAGNOSIS — A0472 Enterocolitis due to Clostridium difficile, not specified as recurrent: Secondary | ICD-10-CM | POA: Diagnosis present

## 2017-05-27 DIAGNOSIS — J9811 Atelectasis: Secondary | ICD-10-CM

## 2017-05-27 DIAGNOSIS — J189 Pneumonia, unspecified organism: Secondary | ICD-10-CM | POA: Diagnosis present

## 2017-05-27 DIAGNOSIS — F411 Generalized anxiety disorder: Secondary | ICD-10-CM | POA: Diagnosis present

## 2017-05-27 DIAGNOSIS — N170 Acute kidney failure with tubular necrosis: Secondary | ICD-10-CM | POA: Diagnosis present

## 2017-05-27 DIAGNOSIS — I272 Pulmonary hypertension, unspecified: Secondary | ICD-10-CM | POA: Diagnosis present

## 2017-05-27 DIAGNOSIS — Z79890 Hormone replacement therapy (postmenopausal): Secondary | ICD-10-CM

## 2017-05-27 DIAGNOSIS — E875 Hyperkalemia: Secondary | ICD-10-CM | POA: Diagnosis not present

## 2017-05-27 DIAGNOSIS — Z9049 Acquired absence of other specified parts of digestive tract: Secondary | ICD-10-CM

## 2017-05-27 DIAGNOSIS — E861 Hypovolemia: Secondary | ICD-10-CM | POA: Diagnosis not present

## 2017-05-27 DIAGNOSIS — E1121 Type 2 diabetes mellitus with diabetic nephropathy: Secondary | ICD-10-CM | POA: Diagnosis present

## 2017-05-27 DIAGNOSIS — E119 Type 2 diabetes mellitus without complications: Secondary | ICD-10-CM

## 2017-05-27 DIAGNOSIS — A419 Sepsis, unspecified organism: Secondary | ICD-10-CM | POA: Diagnosis not present

## 2017-05-27 DIAGNOSIS — N179 Acute kidney failure, unspecified: Secondary | ICD-10-CM

## 2017-05-27 DIAGNOSIS — Z794 Long term (current) use of insulin: Secondary | ICD-10-CM

## 2017-05-27 LAB — COMPREHENSIVE METABOLIC PANEL
ALT: 26 U/L (ref 14–54)
AST: 54 U/L — AB (ref 15–41)
Albumin: 2.6 g/dL — ABNORMAL LOW (ref 3.5–5.0)
Alkaline Phosphatase: 95 U/L (ref 38–126)
Anion gap: 16 — ABNORMAL HIGH (ref 5–15)
BILIRUBIN TOTAL: 0.4 mg/dL (ref 0.3–1.2)
BUN: 34 mg/dL — AB (ref 6–20)
CO2: 19 mmol/L — ABNORMAL LOW (ref 22–32)
CREATININE: 2.24 mg/dL — AB (ref 0.44–1.00)
Calcium: 8.3 mg/dL — ABNORMAL LOW (ref 8.9–10.3)
Chloride: 96 mmol/L — ABNORMAL LOW (ref 101–111)
GFR calc Af Amer: 26 mL/min — ABNORMAL LOW (ref >60)
GFR, EST NON AFRICAN AMERICAN: 23 mL/min — AB (ref >60)
Glucose, Bld: 220 mg/dL — ABNORMAL HIGH (ref 65–99)
POTASSIUM: 4.8 mmol/L (ref 3.5–5.1)
Sodium: 131 mmol/L — ABNORMAL LOW (ref 135–145)
TOTAL PROTEIN: 6.5 g/dL (ref 6.5–8.1)

## 2017-05-27 LAB — BLOOD GAS, ARTERIAL
ACID-BASE DEFICIT: 5.5 mmol/L — AB (ref 0.0–2.0)
BICARBONATE: 19.1 mmol/L — AB (ref 20.0–28.0)
FIO2: 0.3
O2 SAT: 91 %
PATIENT TEMPERATURE: 37
pCO2 arterial: 33 mmHg (ref 32.0–48.0)
pH, Arterial: 7.37 (ref 7.350–7.450)
pO2, Arterial: 63 mmHg — ABNORMAL LOW (ref 83.0–108.0)

## 2017-05-27 LAB — CBC WITH DIFFERENTIAL/PLATELET
BASOS ABS: 0 10*3/uL (ref 0–0.1)
Basophils Relative: 0 %
Eosinophils Absolute: 0.1 10*3/uL (ref 0–0.7)
Eosinophils Relative: 2 %
HEMATOCRIT: 25.5 % — AB (ref 35.0–47.0)
Hemoglobin: 8.1 g/dL — ABNORMAL LOW (ref 12.0–16.0)
LYMPHS PCT: 18 %
Lymphs Abs: 1.9 10*3/uL (ref 1.0–3.6)
MCH: 29.6 pg (ref 26.0–34.0)
MCHC: 31.7 g/dL — ABNORMAL LOW (ref 32.0–36.0)
MCV: 93.2 fL (ref 80.0–100.0)
MONO ABS: 0.8 10*3/uL (ref 0.2–0.9)
MONOS PCT: 8 %
NEUTROS ABS: 7.3 10*3/uL — AB (ref 1.4–6.5)
Neutrophils Relative %: 72 %
Platelets: 232 10*3/uL (ref 150–440)
RBC: 2.73 MIL/uL — ABNORMAL LOW (ref 3.80–5.20)
RDW: 16.5 % — AB (ref 11.5–14.5)
WBC: 10.2 10*3/uL (ref 3.6–11.0)

## 2017-05-27 LAB — TROPONIN I: TROPONIN I: 0.03 ng/mL — AB (ref <0.03)

## 2017-05-27 MED ORDER — IOPAMIDOL (ISOVUE-370) INJECTION 76%
75.0000 mL | Freq: Once | INTRAVENOUS | Status: AC | PRN
  Administered 2017-05-27: 75 mL via INTRAVENOUS

## 2017-05-27 MED ORDER — FENTANYL CITRATE (PF) 100 MCG/2ML IJ SOLN
INTRAMUSCULAR | Status: AC
  Administered 2017-05-27: 100 ug
  Filled 2017-05-27: qty 2

## 2017-05-27 MED ORDER — NOREPINEPHRINE BITARTRATE 1 MG/ML IV SOLN
0.0000 ug/min | INTRAVENOUS | Status: DC
  Filled 2017-05-27: qty 4

## 2017-05-27 NOTE — Consult Note (Signed)
PULMONARY / CRITICAL CARE MEDICINE   Name: Kim Stevens MRN: [id number] DOB: 1955-10-26    ADMISSION DATE:  05/11/2017  CONSULTATION DATE:  05/12/2017  REFERRING MD:  Dr. Jannifer Franklin  CHIEF COMPLAINT:  PEA arrest  HISTORY OF PRESENT ILLNESS:  Kim Stevens is a 61 year old female with known history of anxiety, diabetes mellitus, hypertension, IBS and CAD status post CABG on November 19.  Her hospital course was complicated by cardiac arrest.  Thereafter she was sent to [facility] for rehab.  On 12/18 around 9 PM patient was found to be unresponsive.  EMS was called.  When EMS arrived on the scene patient was pulseless and in asystole.  She received 1 round of epi and 3 rounds of CPR with ROSC.  She had King airway placed by the EMS which was exchanged with an ET tube without any neurological activity.  Her GCS was noted to be 3 and initial EKG rhythm was negative for STEMI. Patient was admitted by the hospitalist.  CCM team was consulted for further management.    Discussed the case with Dr. Emmit Alexanders and decided to initiate Hypothermia protocol( 33 C).  PAST MEDICAL HISTORY :  She  has a past medical history of Anxiety state (01/22/2011), Combined forms of age-related cataract of both eyes (03/30/2014), Diabetes mellitus without complication ([place]), HTN (hypertension) (08/30/2013), Hypertension, Irritable bowel syndrome (01/22/2011), Microalbuminuria (08/15/2015), Polyneuropathy associated with underlying disease ([place]) (08/15/2015), and Type 2 diabetes mellitus not at goal [facility]) (08/15/2015).  PAST SURGICAL HISTORY: She  has a past surgical history that includes Cholecystectomy; Eye surgery; Colonoscopy with propofol (N/A, 07/15/2016); and polypectomy (07/15/2016).  Allergies  Allergen Reactions  . Meperidine Nausea And Vomiting       . Oxycodone-Acetaminophen Nausea And Vomiting         No current facility-administered medications on file prior to encounter.    Current Outpatient Medications on File  Prior to Encounter  Medication Sig  . acetaminophen (TYLENOL) 325 MG tablet Take 650 mg by mouth every 6 (six) hours as needed for mild pain.  Marland Kitchen albuterol (PROVENTIL) (2.5 MG/3ML) 0.083% nebulizer solution Take 2.5 mg by nebulization 2 (two) times daily as needed for wheezing or shortness of breath.  Marland Kitchen antiseptic oral rinse (BIOTENE) LIQD 15 mLs by Mouth Rinse route every 2 (two) hours as needed for dry mouth.  Marland Kitchen atorvastatin (LIPITOR) 20 MG tablet Take 20 mg by mouth daily at 6 PM.  . bisacodyl (DULCOLAX) 10 MG suppository Place 10 mg rectally daily as needed for moderate constipation.  . cholecalciferol (VITAMIN D) 1000 units tablet Take 1,000 Units by mouth daily.  . Cold Sore Products (BLISTEX EX) Apply 1 application topically every 2 (two) hours as needed (dry lips).  . gabapentin (NEURONTIN) 300 MG capsule Take 300 mg by mouth every 6 (six) hours as needed (neuropathy).  . Glucagon, rDNA, (GLUCAGON EMERGENCY IJ) Inject 0.25 mLs as directed as needed (low blood sugar).  . heparin 10000 UNIT/ML injection Inject 5,000 Units into the skin every 8 (eight) hours.  . insulin glargine (LANTUS) 100 UNIT/ML injection Inject 14 Units into the skin at bedtime.   . insulin lispro (HUMALOG) 100 UNIT/ML injection Inject 0-10 Units into the skin 4 (four) times daily. 0-150= 0 units 151-200= 2 units 201-250= 4 units 251-300= 6 units 301-350= 8 units Greater than 350= 10 units. Recheck in 2 hours. Call MD  . Lactobacillus Rhamnosus, GG, (CULTURELLE) CAPS Take 1 capsule by mouth daily.  Marland Kitchen levothyroxine (SYNTHROID,  LEVOTHROID) 25 MCG tablet Take 25 mcg by mouth daily before breakfast.  . LORazepam (ATIVAN) 0.5 MG tablet Take 0.5 mg by mouth every 12 (twelve) hours as needed for anxiety.  . Melatonin 3 MG TABS Take 3 mg by mouth at bedtime.  . polyethylene glycol (MIRALAX / GLYCOLAX) packet Take 17 g by mouth daily as needed for mild constipation.  . sildenafil (REVATIO) 20 MG tablet Take 20 mg by mouth 3  (three) times daily.  . sitaGLIPtin (JANUVIA) 50 MG tablet Take 50 mg by mouth daily.  Marland Kitchen spironolactone (ALDACTONE) 25 MG tablet Take 25 mg by mouth daily.  Marland Kitchen torsemide (DEMADEX) 20 MG tablet Take 40 mg by mouth daily.  . Vitamin D, Ergocalciferol, (DRISDOL) 50000 units CAPS capsule Take 50,000 Units by mouth every 7 (seven) days.  Marland Kitchen amLODipine (NORVASC) 10 MG tablet Take by mouth.  Marland Kitchen atorvastatin (LIPITOR) 40 MG tablet Take by mouth.  Marland Kitchen lisinopril-hydrochlorothiazide (PRINZIDE,ZESTORETIC) 20-25 MG tablet Take by mouth.    FAMILY HISTORY:  Her indicated that her mother is alive. She indicated that her father is deceased. She indicated that her maternal grandmother is deceased.   SOCIAL HISTORY: She  reports that  has never smoked. she has never used smokeless tobacco. She reports that she drinks alcohol. She reports that she does not use drugs.  REVIEW OF SYSTEMS:   Unable to obtain  SUBJECTIVE:  Unable to obtain VITAL SIGNS: BP (!) 77/43   Pulse 78   Resp 20   SpO2 99%   HEMODYNAMICS:    VENTILATOR SETTINGS: Vent Mode: AC FiO2 (%):  [30 %] 30 % Set Rate:  [14 bmp] 14 bmp Vt Set:  [500 mL] 500 mL PEEP:  [5 cmH20] 5 cmH20  INTAKE / OUTPUT: No intake/output data recorded.  PHYSICAL EXAMINATION: General: 61 year old female,intubated and on mechanical ventilation Neuro: Unresponsive HEENT: AT, [place], pupils 3 nonreactive Cardiovascular: S1-S2, sinus rhythm, no m/r/g Lungs: Diminished bibasilar, no wheezes, crackles or rhonchi Abdomen: Soft, active bowel sounds Musculoskeletal: No edema, cyanosis Skin: Chest incision covered with dressings, CDI  LABS:  BMET Recent Labs  Lab 06/07/2017 2220  NA 131*  K 4.8  CL 96*  CO2 19*  BUN 34*  CREATININE 2.24*  GLUCOSE 220*    Electrolytes Recent Labs  Lab 05/22/2017 2220  CALCIUM 8.3*    CBC Recent Labs  Lab 05/30/2017 2220  WBC 10.2  HGB 8.1*  HCT 25.5*  PLT 232    Coag's No results for input(s): APTT, INR  in the last 168 hours.  Sepsis Markers No results for input(s): LATICACIDVEN, PROCALCITON, O2SATVEN in the last 168 hours.  ABG Recent Labs  Lab 05/18/2017 2319  PHART 7.37  PCO2ART 33  PO2ART 63*    Liver Enzymes Recent Labs  Lab 05/20/2017 2220  AST 54*  ALT 26  ALKPHOS 95  BILITOT 0.4  ALBUMIN 2.6*    Cardiac Enzymes Recent Labs  Lab 05/15/2017 2220  TROPONINI 0.03*    Glucose No results for input(s): GLUCAP in the last 168 hours.  Imaging Ct Angio Chest Pe W And/or Wo Contrast  Result Date: 05/18/2017 CLINICAL DATA:  Unresponsive at nursing home, hypotensive EXAM: CT ANGIOGRAPHY CHEST WITH CONTRAST TECHNIQUE: Multidetector CT imaging of the chest was performed using the standard protocol during bolus administration of intravenous contrast. Multiplanar CT image reconstructions and MIPs were obtained to evaluate the vascular anatomy. CONTRAST:  83mL ISOVUE-370 IOPAMIDOL (ISOVUE-370) INJECTION 76% COMPARISON:  06/01/2017 FINDINGS: Cardiovascular: Satisfactory opacification of the  pulmonary arteries to the segmental level. No evidence of pulmonary embolism. Atherosclerotic calcifications of the aorta. No aneurysmal dilatation. Post CABG changes. Mild cardiomegaly. Trace pericardial effusion. Coronary vessel calcification. Small foci of venous gas in the left subclavian region. Mediastinum/Nodes: No thyroid mass. Endotracheal tube terminates about 1.6 cm superior to the carina. Esophageal to tip terminates in the stomach. No significantly enlarged mediastinal lymph nodes. Lungs/Pleura: Small bilateral pleural effusions. Partial consolidations in the lower lobes. Negative for pneumothorax Upper Abdomen: No acute abnormality. Musculoskeletal: Healing right third, fourth rib fractures with possible acute fifth through eighth rib fractures antero laterally. Possible acute left third 7, and eighth anterolateral rib fractures. Sternotomy changes. Small amount of gas within the subcutaneous  soft tissues anterior to the xiphoid process, which appears contiguous with sternal wound. No significant fluid collection to suggest soft tissue abscess. Review of the MIP images confirms the above findings. IMPRESSION: 1. Negative for acute pulmonary embolus. 2. Cardiomegaly with post CABG changes.  Trace pericardial fluid. 3. Small pleural effusions. Partial consolidations in the lower lobes, may reflect atelectasis, or mild pneumonia. 4. Small gas collection within the superficial soft tissues anterior to the xiphoid process, may be contiguous with skin surface/sternal wound. No significant fluid collection in this region to suggest soft tissue abscess. Aortic Atherosclerosis (ICD10-I70.0). Electronically Signed   By: Donavan Foil M.D.   On: 05/17/2017 23:17   Dg Chest Portable 1 View  Result Date: 05/19/2017 CLINICAL DATA:  Post intubation. EXAM: PORTABLE CHEST 1 VIEW COMPARISON:  None. FINDINGS: Endotracheal tube tip at the thoracic inlet 2.1 cm from the carina. Low lung volumes. Patient is post median sternotomy. Mild cardiomegaly. Ill-defined perihilar opacities likely pulmonary edema, low lung volumes limit assessment. Streaky opacity in the left mid lung may be atelectasis or scarring. No large pleural effusion or focal airspace disease. No pneumothorax. IMPRESSION: 1. Endotracheal tube tip 2.1 cm from the carina. 2. Low lung volumes. Cardiomegaly post CABG. Probable perihilar edema. Electronically Signed   By: Jeb Levering M.D.   On: 05/22/2017 22:42     STUDIES:  05/19/2017 CTPE>>Negative for acute pulmonary embolus.. Cardiomegaly with post CABG changes.  Trace pericardial fluid. . Small pleural effusions. Partial consolidations in the lower lobes, may reflect atelectasis, or mild pneumonia.. Small gas collection within the superficial soft tissues anteriorto the xiphoid process, may be contiguous with skin surface/sternalwound. No significant fluid collection in this region to suggestsoft  tissue abscess.  12/19 CT head>> Normal brain  1128/2018 ECHO>> LVEF 45%  CULTURES: Blood culture>> Sputum Culture>>  ANTIBIOTICS: None  SIGNIFICANT EVENTS: 11/19 [facility]G at [facility] 12/12 PEA arrest 12/19 PEA arrest  LINES/TUBES: 12/19 ET tube    ASSESSMENT / PLAN:  PULMONARY A: Acute Respiratory failure s/p PEA arrest  Hx of Pulmonary Hypertension  P:   Full vent support VAP bundle Implemented SBT trials when tolerated CXR in am ABG  CARDIOVASCULAR A:  PEA arrest CAD S/P CABG P:  Continuous Telemetry Keep MAP goal>65 Cardiology consulted F/u ECHO   RENAL A:   Acute Kidney Injury P:   Strict I/O Follow BMP Replace electrolytes  GASTROINTESTINAL A:   C-diff P:    Will start on Vancomycin  Famotidine for GIP  HEMATOLOGIC A:   No active issues P: Will transfuse if Hgb<7   INFECTIOUS A:   C-diff positive P:   Started on vancomycin  ENDOCRINE A:   DM P:   Blood Glucose checks with SSI coverage  NEUROLOGIC A:   Severe  Encephalopathy Post cardiac arrest P:   GCS =3 Will follow up CT head, Will need to Obtain MRI if patient is not showing appropriate response CT head Negative TTM -33C(Discussed with Dr.Sommers)  FAMILY  - Updates: Husband as well as daughter was updated regarding plan of care     Bincy Varughese,AG-ACNP Pulmonary and [place]   05/18/2017, 11:49 PM

## 2017-05-27 NOTE — ED Triage Notes (Signed)
Pt presents with open incision on midline of chest. Incision was cover with sterile guaze bandage.

## 2017-05-27 NOTE — Significant Event (Signed)
Cardiology STEMI Evaluation Note  Date: 05/10/2017 Time: 10:27 PM  I was contacted by Dr. Alfred Levins regarding Ms. Bick, who is status post cardiac arrest. She was reportedly found unresponsive in her nursing home this evening and was found to be in PEA when EMS arrived. She received CPR and epinephrine with ROSC. She was initially hypertensive but is now hypotensive the ED. She is intubated and without any neurologic activity, per Dr. Alfred Levins. I have reviewed her ED EKG, which shows sinus rhythm with non-specific ST segment changes. EKG does not meet STEMI criteria. Given uncertain duration of unresponsiveness, PEA as initial rhythm, and EKG without STEMI, I think it is best to evaluate the patient for other causes of cardiac arrest, including PE and pericardial effusion (history of CABG at [facility] 1 month ago). I will defer further cardiac workup and management to [facility] cardiology, as the patient follows closely with [facility] cardiology.  Nelva Bush, MD [facility] Pager: [phone]

## 2017-05-27 NOTE — ED Provider Notes (Signed)
[facility] Emergency Department Provider Note  ____________________________________________  Time seen: Approximately 11:32 PM  I have reviewed the triage vital signs and the nursing notes.   HISTORY  Chief Complaint Cardiac Arrest  Level 5 caveat:  Portions of the history and physical were unable to be obtained due to cardiac arrest   HPI Kim Stevens is a 61 y.o. female with a history of CAD status post CABG a month ago complicated by cardiac arrest, hypertension, diabetes who presents for evaluation of a cardiac arrest. According to EMS patient was found pulseless with initial rhythm of the PEA arrest. She received 1 round of epi and 3 rounds of CPR with return of spontaneous circulation. Patient arrives with a King airway, GCS of 3, sinus rhythm on initial EKG with no evidence of STEMI.  Past Medical History:  Diagnosis Date  . Anxiety state 01/22/2011  . Combined forms of age-related cataract of both eyes 03/30/2014  . Diabetes mellitus without complication ([place])   . HTN (hypertension) 08/30/2013  . Hypertension   . Irritable bowel syndrome 01/22/2011  . Microalbuminuria 08/15/2015  . Polyneuropathy associated with underlying disease ([place]) 08/15/2015   Overview:  Gabapentin qhs  . Type 2 diabetes mellitus not at goal [facility]) 08/15/2015   Overview:  Can no longer go to [facility]. Referred to [facility] 3/17    Patient Active Problem List   Diagnosis Date Noted  . Noninfectious diarrhea   . Benign neoplasm of ascending colon   . Benign neoplasm of transverse colon   . Microalbuminuria 08/15/2015  . Polyneuropathy associated with underlying disease ([place]) 08/15/2015  . Type 2 diabetes mellitus not at goal [facility]) 08/15/2015  . Combined forms of age-related cataract of both eyes 03/30/2014  . HTN (hypertension) 08/30/2013  . Anxiety state 01/22/2011  . Irritable bowel syndrome 01/22/2011    Past Surgical History:  Procedure Laterality Date  .  CHOLECYSTECTOMY    . COLONOSCOPY WITH PROPOFOL N/A 07/15/2016   Procedure: COLONOSCOPY WITH PROPOFOL;  Surgeon: Lucilla Lame, MD;  Location: [place];  Service: Endoscopy;  Laterality: N/A;  Diabetic - insulin  . EYE SURGERY    . POLYPECTOMY  07/15/2016   Procedure: POLYPECTOMY;  Surgeon: Lucilla Lame, MD;  Location: [place];  Service: Endoscopy;;    Prior to Admission medications   Medication Sig Start Date End Date Taking? Authorizing Provider  acetaminophen (TYLENOL) 325 MG tablet Take 650 mg by mouth every 6 (six) hours as needed for mild pain.   Yes [provider]  albuterol (PROVENTIL) (2.5 MG/3ML) 0.083% nebulizer solution Take 2.5 mg by nebulization 2 (two) times daily as needed for wheezing or shortness of breath.   Yes [provider]  antiseptic oral rinse (BIOTENE) LIQD 15 mLs by Mouth Rinse route every 2 (two) hours as needed for dry mouth.   Yes [provider]  atorvastatin (LIPITOR) 20 MG tablet Take 20 mg by mouth daily at 6 PM.   Yes [provider]  bisacodyl (DULCOLAX) 10 MG suppository Place 10 mg rectally daily as needed for moderate constipation.   Yes [provider]  cholecalciferol (VITAMIN D) 1000 units tablet Take 1,000 Units by mouth daily.   Yes [provider]  Cold Sore Products (BLISTEX EX) Apply 1 application topically every 2 (two) hours as needed (dry lips).   Yes [provider]  gabapentin (NEURONTIN) 300 MG capsule Take 300 mg by mouth every 6 (six) hours as needed (neuropathy).  Yes [provider]  Glucagon, rDNA, (GLUCAGON EMERGENCY IJ) Inject 0.25 mLs as directed as needed (low blood sugar).   Yes [provider]  heparin 10000 UNIT/ML injection Inject 5,000 Units into the skin every 8 (eight) hours.   Yes [provider]  insulin glargine (LANTUS) 100 UNIT/ML injection Inject 14 Units into the skin at bedtime.    Yes [provider]    insulin lispro (HUMALOG) 100 UNIT/ML injection Inject 0-10 Units into the skin 4 (four) times daily. 0-150= 0 units 151-200= 2 units 201-250= 4 units 251-300= 6 units 301-350= 8 units Greater than 350= 10 units. Recheck in 2 hours. Call MD   Yes [provider]  Lactobacillus Rhamnosus, GG, (CULTURELLE) CAPS Take 1 capsule by mouth daily.   Yes [provider]  levothyroxine (SYNTHROID, LEVOTHROID) 25 MCG tablet Take 25 mcg by mouth daily before breakfast.   Yes [provider]  LORazepam (ATIVAN) 0.5 MG tablet Take 0.5 mg by mouth every 12 (twelve) hours as needed for anxiety.   Yes [provider]  Melatonin 3 MG TABS Take 3 mg by mouth at bedtime.   Yes [provider]  polyethylene glycol (MIRALAX / GLYCOLAX) packet Take 17 g by mouth daily as needed for mild constipation.   Yes [provider]  sildenafil (REVATIO) 20 MG tablet Take 20 mg by mouth 3 (three) times daily.   Yes [provider]  sitaGLIPtin (JANUVIA) 50 MG tablet Take 50 mg by mouth daily.   Yes [provider]  spironolactone (ALDACTONE) 25 MG tablet Take 25 mg by mouth daily.   Yes [provider]  torsemide (DEMADEX) 20 MG tablet Take 40 mg by mouth daily.   Yes [provider]  Vitamin D, Ergocalciferol, (DRISDOL) 50000 units CAPS capsule Take 50,000 Units by mouth every 7 (seven) days.   Yes [provider]  amLODipine (NORVASC) 10 MG tablet Take by mouth. 08/22/15 08/21/16  [provider]  atorvastatin (LIPITOR) 40 MG tablet Take by mouth. 08/15/15 08/14/16  [provider]  lisinopril-hydrochlorothiazide (PRINZIDE,ZESTORETIC) 20-25 MG tablet Take by mouth. 08/15/15 08/14/16  [provider]    Allergies Meperidine and Oxycodone-acetaminophen  Family History  Problem Relation Age of Onset  . Cancer Mother   . Diabetes Father   . Stroke Maternal Grandmother     Social History Social History    Tobacco Use  . Smoking status: Never Smoker  . Smokeless tobacco: Never Used  Substance Use Topics  . Alcohol use: Yes    Comment: occasionally: 1-2 drinks/month  . Drug use: No    Review of Systems + cardiac arrest  Level 5 caveat:  Portions of the history and physical were unable to be obtained due to cardiac arrest  ____________________________________________   PHYSICAL EXAM:  VITAL SIGNS: ED Triage Vitals  Enc Vitals Group     BP 05/14/2017 2213 (!) 156/139     Pulse Rate 05/24/2017 2215 82     Resp 06/06/2017 2213 20     Temp --      Temp src --      SpO2 05/20/2017 2215 100 %     Weight --      Height --      Head Circumference --      Peak Flow --      Pain Score --      Pain Loc --      Pain Edu? --  Excl. in GC? --     Constitutional: GCS 3 HEENT:      Head: Normocephalic and atraumatic.         Eyes: Fixed at 4 mm.        Mouth/Throat: King airway in place Cardiovascular: Regular rate and rhythm. Open CABG scar with minimal bleeding. Respiratory: Bilateral breath sounds, no crackles Gastrointestinal: Soft and non distended  Musculoskeletal: 2+ pitting edema on b/l LE Neurologic: GCS 3, no gag, cough, or corneal reflex Skin: Skin is warm, dry and intact. No rash noted.   ____________________________________________   LABS (all labs ordered are listed, but only abnormal results are displayed)  Labs Reviewed  CBC WITH DIFFERENTIAL/PLATELET - Abnormal; Notable for the following components:      Result Value   RBC 2.73 (*)    Hemoglobin 8.1 (*)    HCT 25.5 (*)    MCHC 31.7 (*)    RDW 16.5 (*)    Neutro Abs 7.3 (*)    All other components within normal limits  COMPREHENSIVE METABOLIC PANEL - Abnormal; Notable for the following components:   Sodium 131 (*)    Chloride 96 (*)    CO2 19 (*)    Glucose, Bld 220 (*)    BUN 34 (*)    Creatinine, Ser 2.24 (*)    Calcium 8.3 (*)    Albumin 2.6 (*)    AST 54 (*)    GFR calc non Af Amer 23 (*)     GFR calc Af Amer 26 (*)    Anion gap 16 (*)    All other components within normal limits  TROPONIN I - Abnormal; Notable for the following components:   Troponin I 0.03 (*)    All other components within normal limits  BLOOD GAS, ARTERIAL - Abnormal; Notable for the following components:   pO2, Arterial 63 (*)    Bicarbonate 19.1 (*)    Acid-base deficit 5.5 (*)    All other components within normal limits  LACTIC ACID, PLASMA   ____________________________________________  EKG  ED ECG REPORT I, Rudene Re, the attending physician, personally viewed and interpreted this ECG.   Normal sinus rhythm, rate of 79, normal intervals, right axis deviation, no ST elevations or depressions. ____________________________________________  [id number]  CTA chest: 1. Negative for acute pulmonary embolus. 2. Cardiomegaly with post CABG changes. Trace pericardial fluid. 3. Small pleural effusions. Partial consolidations in the lower lobes, may reflect atelectasis, or mild pneumonia. 4. Small gas collection within the superficial soft tissues anterior to the xiphoid process, may be contiguous with skin surface/sternal wound. No significant fluid collection in this region to suggest soft tissue abscess.  CXR:   1. Endotracheal tube tip 2.1 cm from the carina. 2. Low lung volumes. Cardiomegaly post CABG. Probable perihilar edema. ____________________________________________   PROCEDURES  Procedure(s) performed:yes Procedure Name: Intubation Date/Time: 05/14/2017 11:43 PM Performed by: Rudene Re, MD Pre-anesthesia Checklist: Patient identified, Emergency Drugs available, Suction available and Patient being monitored Oxygen Delivery Method: Non-rebreather mask Preoxygenation: Pre-oxygenation with 100% oxygen Laryngoscope Size: Glidescope and 3 Tube size: 7.5 mm Number of attempts: 1 Placement Confirmation: ETT inserted through vocal cords under direct vision,  CO2  detector,  Breath sounds checked- equal and bilateral and Positive ETCO2 Secured at: 23 cm Tube secured with: ETT holder Dental Injury: Teeth and Oropharynx as per pre-operative assessment       Critical Care performed: yes  CRITICAL CARE Performed by: Rudene Re  ?  Total critical care  time: 60 min  Critical care time was exclusive of separately billable procedures and treating other patients.  Critical care was necessary to treat or prevent imminent or life-threatening deterioration.  Critical care was time spent personally by me on the following activities: development of treatment plan with patient and/or surrogate as well as nursing, discussions with consultants, evaluation of patient's response to treatment, examination of patient, obtaining history from patient or surrogate, ordering and performing treatments and interventions, ordering and review of laboratory studies, ordering and review of radiographic studies, pulse oximetry and re-evaluation of patient's condition.  ____________________________________________   INITIAL IMPRESSION / ASSESSMENT AND PLAN / ED COURSE   61 y.o. female with a history of CAD status post CABG a month ago complicated by cardiac arrest, hypertension, diabetes who presents for evaluation of a PEA cardiac arrest s/p ROS in the field. Patient arrives with a GCS of 3 on no sedation, she has a pulse on arrival and a normal blood pressure, patient with a King airway which was switched to an ET tube with no sedation or paralytics. EKG with no evidence of a STEMI. Discussed with Dr. Saunders Revel and Dr. Saralyn Pilar, cardiologists on call who recommended no cath lab tonight since initial rhythm was PEA arrest and EKG shows no evidence of ischemia. Patient then became hypotensive with systolics in the 09F after intubation however hypotension resolved after a liter of normal saline. Norepi was ordered however never started due to normal pressure once the medication  was available at the bedside. POCUS done by me with no evidence of pericardial effusion, wall motion abnormality, or large RV. Patient was sent for a CT angiogram of the chest status to rule out a massive PE. CT is negative for PE or pericardial effusion. Patient remains intubated with no sedation at this time. Cooling protocol has been initiated. I have discussed with the hospitalist and intensivist for admission to the ICU. Patient's husband and daughters are at the bedside and have been updated on patient's status and results of evaluation so far.      As part of my medical decision making, I reviewed the following data within the [place] notes reviewed and incorporated, Labs reviewed , EKG interpreted , Old EKG reviewed, Old chart reviewed, Radiograph reviewed , Discussed with admitting physician , A consult was requested and obtained from this/these consultant(s) Cardiology, Notes from prior ED visits and [place] Controlled Substance Database    Pertinent labs & imaging results that were available during my care of the patient were reviewed by me and considered in my medical decision making (see chart for details).    ____________________________________________   FINAL CLINICAL IMPRESSION(S) / ED DIAGNOSES  Final diagnoses:  Cardiac arrest ([place])      NEW MEDICATIONS STARTED DURING THIS VISIT:  ED Discharge Orders    None       Note:  This document was prepared using Dragon voice recognition software and may include unintentional dictation errors.    Rudene Re, MD 06/09/2017 [phone]

## 2017-05-27 NOTE — ED Triage Notes (Addendum)
Pt presents todayvia [facility] from [place] home. Pt is 1 mth  post CABG for 2 artiers block at [facility].Pt was found unresponsive between 9-10 pm by staff the time of is unknown as to how long pt was this way. When EMS arrived pt was PEA Ems started CPR with Linton Rump for 3-5 rounds and was given Epi. pt then showed Sinus Tach. .Pt vs with EMS is 239/110 HR 140. Pt presents with lucas on pt EDP at bedside. Pt chest incision is open. EDP at bedside.

## 2017-05-28 ENCOUNTER — Inpatient Hospital Stay: Payer: BC Managed Care – PPO

## 2017-05-28 ENCOUNTER — Inpatient Hospital Stay
Admit: 2017-05-28 | Discharge: 2017-05-28 | Disposition: A | Discharge status: Home / Self Care | Payer: BC Managed Care – PPO | Attending: Cardiology | Admitting: Cardiology

## 2017-05-28 DIAGNOSIS — G931 Anoxic brain damage, not elsewhere classified: Secondary | ICD-10-CM | POA: Diagnosis not present

## 2017-05-28 DIAGNOSIS — N170 Acute kidney failure with tubular necrosis: Secondary | ICD-10-CM | POA: Diagnosis present

## 2017-05-28 DIAGNOSIS — R402212 Coma scale, best verbal response, none, at arrival to emergency department: Secondary | ICD-10-CM | POA: Diagnosis present

## 2017-05-28 DIAGNOSIS — Z7901 Long term (current) use of anticoagulants: Secondary | ICD-10-CM | POA: Diagnosis not present

## 2017-05-28 DIAGNOSIS — A419 Sepsis, unspecified organism: Secondary | ICD-10-CM | POA: Diagnosis present

## 2017-05-28 DIAGNOSIS — J9601 Acute respiratory failure with hypoxia: Secondary | ICD-10-CM | POA: Diagnosis not present

## 2017-05-28 DIAGNOSIS — G9341 Metabolic encephalopathy: Secondary | ICD-10-CM | POA: Diagnosis not present

## 2017-05-28 DIAGNOSIS — E871 Hypo-osmolality and hyponatremia: Secondary | ICD-10-CM | POA: Diagnosis present

## 2017-05-28 DIAGNOSIS — N179 Acute kidney failure, unspecified: Secondary | ICD-10-CM | POA: Diagnosis not present

## 2017-05-28 DIAGNOSIS — A0472 Enterocolitis due to Clostridium difficile, not specified as recurrent: Secondary | ICD-10-CM | POA: Diagnosis present

## 2017-05-28 DIAGNOSIS — N183 Chronic kidney disease, stage 3 (moderate): Secondary | ICD-10-CM | POA: Diagnosis present

## 2017-05-28 DIAGNOSIS — Z9049 Acquired absence of other specified parts of digestive tract: Secondary | ICD-10-CM | POA: Diagnosis not present

## 2017-05-28 DIAGNOSIS — R402312 Coma scale, best motor response, none, at arrival to emergency department: Secondary | ICD-10-CM | POA: Diagnosis present

## 2017-05-28 DIAGNOSIS — I429 Cardiomyopathy, unspecified: Secondary | ICD-10-CM | POA: Diagnosis present

## 2017-05-28 DIAGNOSIS — J189 Pneumonia, unspecified organism: Secondary | ICD-10-CM | POA: Diagnosis present

## 2017-05-28 DIAGNOSIS — J90 Pleural effusion, not elsewhere classified: Secondary | ICD-10-CM | POA: Diagnosis present

## 2017-05-28 DIAGNOSIS — E874 Mixed disorder of acid-base balance: Secondary | ICD-10-CM | POA: Diagnosis not present

## 2017-05-28 DIAGNOSIS — I469 Cardiac arrest, cause unspecified: Secondary | ICD-10-CM | POA: Diagnosis present

## 2017-05-28 DIAGNOSIS — E119 Type 2 diabetes mellitus without complications: Secondary | ICD-10-CM | POA: Diagnosis not present

## 2017-05-28 DIAGNOSIS — E1142 Type 2 diabetes mellitus with diabetic polyneuropathy: Secondary | ICD-10-CM | POA: Diagnosis present

## 2017-05-28 DIAGNOSIS — E1121 Type 2 diabetes mellitus with diabetic nephropathy: Secondary | ICD-10-CM | POA: Diagnosis present

## 2017-05-28 DIAGNOSIS — I129 Hypertensive chronic kidney disease with stage 1 through stage 4 chronic kidney disease, or unspecified chronic kidney disease: Secondary | ICD-10-CM | POA: Diagnosis present

## 2017-05-28 DIAGNOSIS — E1122 Type 2 diabetes mellitus with diabetic chronic kidney disease: Secondary | ICD-10-CM | POA: Diagnosis present

## 2017-05-28 DIAGNOSIS — Z951 Presence of aortocoronary bypass graft: Secondary | ICD-10-CM | POA: Diagnosis not present

## 2017-05-28 DIAGNOSIS — R402112 Coma scale, eyes open, never, at arrival to emergency department: Secondary | ICD-10-CM | POA: Diagnosis present

## 2017-05-28 DIAGNOSIS — Z7189 Other specified counseling: Secondary | ICD-10-CM | POA: Diagnosis not present

## 2017-05-28 DIAGNOSIS — I2510 Atherosclerotic heart disease of native coronary artery without angina pectoris: Secondary | ICD-10-CM | POA: Diagnosis present

## 2017-05-28 DIAGNOSIS — J9600 Acute respiratory failure, unspecified whether with hypoxia or hypercapnia: Secondary | ICD-10-CM | POA: Diagnosis not present

## 2017-05-28 DIAGNOSIS — T8130XA Disruption of wound, unspecified, initial encounter: Secondary | ICD-10-CM | POA: Diagnosis not present

## 2017-05-28 LAB — BASIC METABOLIC PANEL
Anion gap: 11 (ref 5–15)
Anion gap: 11 (ref 5–15)
Anion gap: 9 (ref 5–15)
Anion gap: 9 (ref 5–15)
Anion gap: 8 (ref 5–15)
Anion gap: 8 (ref 5–15)
Anion gap: 5 (ref 5–15)
Anion gap: 4 — ABNORMAL LOW (ref 5–15)
BUN: 34 mg/dL — AB (ref 6–20)
BUN: 37 mg/dL — AB (ref 6–20)
BUN: 36 mg/dL — AB (ref 6–20)
BUN: 38 mg/dL — AB (ref 6–20)
BUN: 37 mg/dL — AB (ref 6–20)
BUN: 39 mg/dL — AB (ref 6–20)
BUN: 38 mg/dL — AB (ref 6–20)
BUN: 37 mg/dL — AB (ref 6–20)
CALCIUM: 7.9 mg/dL — AB (ref 8.9–10.3)
CALCIUM: 7.8 mg/dL — AB (ref 8.9–10.3)
CALCIUM: 7.7 mg/dL — AB (ref 8.9–10.3)
CHLORIDE: 101 mmol/L (ref 101–111)
CHLORIDE: 102 mmol/L (ref 101–111)
CHLORIDE: 102 mmol/L (ref 101–111)
CHLORIDE: 106 mmol/L (ref 101–111)
CO2: 19 mmol/L — ABNORMAL LOW (ref 22–32)
CO2: 20 mmol/L — ABNORMAL LOW (ref 22–32)
CO2: 21 mmol/L — ABNORMAL LOW (ref 22–32)
CO2: 21 mmol/L — ABNORMAL LOW (ref 22–32)
CO2: 21 mmol/L — ABNORMAL LOW (ref 22–32)
CO2: 21 mmol/L — ABNORMAL LOW (ref 22–32)
CO2: 20 mmol/L — ABNORMAL LOW (ref 22–32)
CO2: 20 mmol/L — AB (ref 22–32)
CREATININE: 2.26 mg/dL — AB (ref 0.44–1.00)
CREATININE: 2.4 mg/dL — AB (ref 0.44–1.00)
CREATININE: 2.38 mg/dL — AB (ref 0.44–1.00)
CREATININE: 2.39 mg/dL — AB (ref 0.44–1.00)
CREATININE: 2.5 mg/dL — AB (ref 0.44–1.00)
CREATININE: 2.56 mg/dL — AB (ref 0.44–1.00)
CREATININE: 2.59 mg/dL — AB (ref 0.44–1.00)
CREATININE: 2.58 mg/dL — AB (ref 0.44–1.00)
Calcium: 7.8 mg/dL — ABNORMAL LOW (ref 8.9–10.3)
Calcium: 7.8 mg/dL — ABNORMAL LOW (ref 8.9–10.3)
Calcium: 7.6 mg/dL — ABNORMAL LOW (ref 8.9–10.3)
Calcium: 7.3 mg/dL — ABNORMAL LOW (ref 8.9–10.3)
Calcium: 7.1 mg/dL — ABNORMAL LOW (ref 8.9–10.3)
Chloride: 100 mmol/L — ABNORMAL LOW (ref 101–111)
Chloride: 100 mmol/L — ABNORMAL LOW (ref 101–111)
Chloride: 101 mmol/L (ref 101–111)
Chloride: 105 mmol/L (ref 101–111)
GFR calc Af Amer: 24 mL/min — ABNORMAL LOW (ref >60)
GFR calc Af Amer: 23 mL/min — ABNORMAL LOW (ref >60)
GFR calc Af Amer: 22 mL/min — ABNORMAL LOW (ref >60)
GFR calc Af Amer: 22 mL/min — ABNORMAL LOW (ref >60)
GFR calc Af Amer: 22 mL/min — ABNORMAL LOW (ref >60)
GFR calc non Af Amer: 20 mL/min — ABNORMAL LOW (ref >60)
GFR calc non Af Amer: 19 mL/min — ABNORMAL LOW (ref >60)
GFR calc non Af Amer: 19 mL/min — ABNORMAL LOW (ref >60)
GFR calc non Af Amer: 19 mL/min — ABNORMAL LOW (ref >60)
GFR, EST AFRICAN AMERICAN: 26 mL/min — AB (ref >60)
GFR, EST AFRICAN AMERICAN: 24 mL/min — AB (ref >60)
GFR, EST AFRICAN AMERICAN: 24 mL/min — AB (ref >60)
GFR, EST NON AFRICAN AMERICAN: 22 mL/min — AB (ref >60)
GFR, EST NON AFRICAN AMERICAN: 21 mL/min — AB (ref >60)
GFR, EST NON AFRICAN AMERICAN: 21 mL/min — AB (ref >60)
GFR, EST NON AFRICAN AMERICAN: 21 mL/min — AB (ref >60)
GLUCOSE: 91 mg/dL (ref 65–99)
GLUCOSE: 85 mg/dL (ref 65–99)
Glucose, Bld: 162 mg/dL — ABNORMAL HIGH (ref 65–99)
Glucose, Bld: 122 mg/dL — ABNORMAL HIGH (ref 65–99)
Glucose, Bld: 106 mg/dL — ABNORMAL HIGH (ref 65–99)
Glucose, Bld: 112 mg/dL — ABNORMAL HIGH (ref 65–99)
Glucose, Bld: 104 mg/dL — ABNORMAL HIGH (ref 65–99)
Glucose, Bld: 108 mg/dL — ABNORMAL HIGH (ref 65–99)
POTASSIUM: 3.3 mmol/L — AB (ref 3.5–5.1)
POTASSIUM: 3.7 mmol/L (ref 3.5–5.1)
POTASSIUM: 3.5 mmol/L (ref 3.5–5.1)
Potassium: 2.9 mmol/L — ABNORMAL LOW (ref 3.5–5.1)
Potassium: 3 mmol/L — ABNORMAL LOW (ref 3.5–5.1)
Potassium: 2.9 mmol/L — ABNORMAL LOW (ref 3.5–5.1)
Potassium: 3.7 mmol/L (ref 3.5–5.1)
Potassium: 4 mmol/L (ref 3.5–5.1)
SODIUM: 130 mmol/L — AB (ref 135–145)
SODIUM: 131 mmol/L — AB (ref 135–145)
SODIUM: 131 mmol/L — AB (ref 135–145)
SODIUM: 131 mmol/L — AB (ref 135–145)
SODIUM: 131 mmol/L — AB (ref 135–145)
SODIUM: 131 mmol/L — AB (ref 135–145)
Sodium: 131 mmol/L — ABNORMAL LOW (ref 135–145)
Sodium: 129 mmol/L — ABNORMAL LOW (ref 135–145)

## 2017-05-28 LAB — BLOOD GAS, ARTERIAL
ACID-BASE DEFICIT: 3.2 mmol/L — AB (ref 0.0–2.0)
BICARBONATE: 19.9 mmol/L — AB (ref 20.0–28.0)
FIO2: 0.4
MECHVT: 500 mL
Mechanical Rate: 16
O2 Saturation: 99.4 %
PCO2 ART: 28 mmHg — AB (ref 32.0–48.0)
PEEP: 5 cmH2O
PH ART: 7.53 — AB (ref 7.350–7.450)
Patient temperature: 32.6
pO2, Arterial: 123 mmHg — ABNORMAL HIGH (ref 83.0–108.0)

## 2017-05-28 LAB — GASTROINTESTINAL PANEL BY PCR, STOOL (REPLACES STOOL CULTURE)

## 2017-05-28 LAB — GLUCOSE, CAPILLARY
GLUCOSE-CAPILLARY: 165 mg/dL — AB (ref 65–99)
GLUCOSE-CAPILLARY: 147 mg/dL — AB (ref 65–99)
GLUCOSE-CAPILLARY: 98 mg/dL (ref 65–99)
GLUCOSE-CAPILLARY: 101 mg/dL — AB (ref 65–99)
GLUCOSE-CAPILLARY: 101 mg/dL — AB (ref 65–99)
GLUCOSE-CAPILLARY: 112 mg/dL — AB (ref 65–99)
GLUCOSE-CAPILLARY: 72 mg/dL (ref 65–99)
GLUCOSE-CAPILLARY: 70 mg/dL (ref 65–99)
GLUCOSE-CAPILLARY: 67 mg/dL (ref 65–99)
GLUCOSE-CAPILLARY: 100 mg/dL — AB (ref 65–99)
Glucose-Capillary: 153 mg/dL — ABNORMAL HIGH (ref 65–99)
Glucose-Capillary: 169 mg/dL — ABNORMAL HIGH (ref 65–99)
Glucose-Capillary: 155 mg/dL — ABNORMAL HIGH (ref 65–99)
Glucose-Capillary: 98 mg/dL (ref 65–99)
Glucose-Capillary: 104 mg/dL — ABNORMAL HIGH (ref 65–99)
Glucose-Capillary: 101 mg/dL — ABNORMAL HIGH (ref 65–99)
Glucose-Capillary: 114 mg/dL — ABNORMAL HIGH (ref 65–99)
Glucose-Capillary: 108 mg/dL — ABNORMAL HIGH (ref 65–99)
Glucose-Capillary: 103 mg/dL — ABNORMAL HIGH (ref 65–99)
Glucose-Capillary: 86 mg/dL (ref 65–99)
Glucose-Capillary: 82 mg/dL (ref 65–99)
Glucose-Capillary: 125 mg/dL — ABNORMAL HIGH (ref 65–99)

## 2017-05-28 LAB — TROPONIN I
TROPONIN I: 0.05 ng/mL — AB (ref <0.03)
TROPONIN I: 0.04 ng/mL — AB (ref <0.03)
TROPONIN I: 0.04 ng/mL — AB (ref <0.03)
Troponin I: 0.06 ng/mL (ref <0.03)

## 2017-05-28 LAB — LACTIC ACID, PLASMA: Lactic Acid, Venous: 0.7 mmol/L (ref 0.5–1.9)

## 2017-05-28 LAB — PROTIME-INR
INR: 1.1
INR: 1.15
PROTHROMBIN TIME: 14.1 s (ref 11.4–15.2)
Prothrombin Time: 14.6 seconds (ref 11.4–15.2)

## 2017-05-28 LAB — CBC
HCT: 23.7 % — ABNORMAL LOW (ref 35.0–47.0)
Hemoglobin: 7.8 g/dL — ABNORMAL LOW (ref 12.0–16.0)
MCH: 29.8 pg (ref 26.0–34.0)
MCHC: 33 g/dL (ref 32.0–36.0)
MCV: 90.2 fL (ref 80.0–100.0)
PLATELETS: 204 10*3/uL (ref 150–440)
RBC: 2.63 MIL/uL — ABNORMAL LOW (ref 3.80–5.20)
RDW: 16 % — AB (ref 11.5–14.5)
WBC: 6.3 10*3/uL (ref 3.6–11.0)

## 2017-05-28 LAB — C DIFFICILE QUICK SCREEN W PCR REFLEX
C DIFFICILE (CDIFF) INTERP: DETECTED
C DIFFICLE (CDIFF) ANTIGEN: POSITIVE — AB
C Diff toxin: POSITIVE — AB

## 2017-05-28 LAB — APTT
APTT: 44 s — AB (ref 24–36)
aPTT: 40 seconds — ABNORMAL HIGH (ref 24–36)

## 2017-05-28 LAB — MRSA PCR SCREENING: MRSA BY PCR: NEGATIVE

## 2017-05-28 LAB — PROCALCITONIN: PROCALCITONIN: 15.05 ng/mL

## 2017-05-28 MED ORDER — NOREPINEPHRINE BITARTRATE 1 MG/ML IV SOLN
0.0000 ug/min | INTRAVENOUS | Status: DC
  Administered 2017-05-28: 2 ug/min via INTRAVENOUS
  Filled 2017-05-28: qty 16

## 2017-05-28 MED ORDER — VANCOMYCIN 50 MG/ML ORAL SOLUTION
500.0000 mg | Freq: Four times a day (QID) | ORAL | Status: DC
  Administered 2017-05-28 – 2017-06-02 (×20): 500 mg
  Filled 2017-05-28 (×25): qty 10

## 2017-05-28 MED ORDER — MIDAZOLAM HCL 2 MG/2ML IJ SOLN: 2.0000 mg | Freq: Once | INTRAMUSCULAR | Status: DC

## 2017-05-28 MED ORDER — PANTOPRAZOLE SODIUM 40 MG IV SOLR: 40.0000 mg | Freq: Every day | INTRAVENOUS | Status: DC

## 2017-05-28 MED ORDER — LACTATED RINGERS IV BOLUS (SEPSIS)
500.0000 mL | Freq: Once | INTRAVENOUS | Status: AC
  Administered 2017-05-28: 500 mL via INTRAVENOUS

## 2017-05-28 MED ORDER — ASPIRIN 300 MG RE SUPP
300.0000 mg | RECTAL | Status: AC
  Administered 2017-05-28: 300 mg via RECTAL

## 2017-05-28 MED ORDER — SODIUM CHLORIDE 0.9 % IV BOLUS (SEPSIS)
250.0000 mL | Freq: Once | INTRAVENOUS | Status: AC
  Administered 2017-05-28: 250 mL via INTRAVENOUS

## 2017-05-28 MED ORDER — SODIUM CHLORIDE 0.9 % IV SOLN
2.0000 mg/h | INTRAVENOUS | Status: DC
  Administered 2017-05-28 (×2): 2 mg/h via INTRAVENOUS
  Filled 2017-05-28 (×2): qty 10

## 2017-05-28 MED ORDER — FENTANYL CITRATE (PF) 100 MCG/2ML IJ SOLN: 100.0000 ug | Freq: Once | INTRAMUSCULAR | Status: DC

## 2017-05-28 MED ORDER — SODIUM CHLORIDE 0.9 % IV SOLN
68.0000 ug/min | INTRAVENOUS | Status: DC
  Administered 2017-05-28: 68 ug/min via INTRAVENOUS
  Filled 2017-05-28: qty 20

## 2017-05-28 MED ORDER — NOREPINEPHRINE BITARTRATE 1 MG/ML IV SOLN
0.0000 ug/min | INTRAVENOUS | Status: DC
  Filled 2017-05-28: qty 16

## 2017-05-28 MED ORDER — ASPIRIN 300 MG RE SUPP
300.0000 mg | RECTAL | Status: DC
  Filled 2017-05-28: qty 1

## 2017-05-28 MED ORDER — FENTANYL BOLUS VIA INFUSION
50.0000 ug | INTRAVENOUS | Status: DC | PRN
  Filled 2017-05-28: qty 50

## 2017-05-28 MED ORDER — MIDAZOLAM BOLUS VIA INFUSION
2.0000 mg | INTRAVENOUS | Status: DC | PRN
  Filled 2017-05-28: qty 2

## 2017-05-28 MED ORDER — FENTANYL 2500MCG IN NS 250ML (10MCG/ML) PREMIX INFUSION
100.0000 ug/h | INTRAVENOUS | Status: DC
  Administered 2017-05-28 – 2017-05-29 (×2): 100 ug/h via INTRAVENOUS
  Filled 2017-05-28 (×2): qty 250

## 2017-05-28 MED ORDER — ASPIRIN 81 MG PO CHEW
81.0000 mg | CHEWABLE_TABLET | Freq: Every day | ORAL | Status: DC
  Administered 2017-05-28 – 2017-06-02 (×6): 81 mg
  Filled 2017-05-28 (×6): qty 1

## 2017-05-28 MED ORDER — HEPARIN SODIUM (PORCINE) 5000 UNIT/ML IJ SOLN
5000.0000 [IU] | Freq: Three times a day (TID) | INTRAMUSCULAR | Status: DC
  Administered 2017-05-28 – 2017-05-30 (×9): 5000 [IU] via SUBCUTANEOUS
  Filled 2017-05-28 (×9): qty 1

## 2017-05-28 MED ORDER — FAMOTIDINE IN NACL 20-0.9 MG/50ML-% IV SOLN
20.0000 mg | Freq: Every day | INTRAVENOUS | Status: DC
  Administered 2017-05-28 – 2017-05-29 (×2): 20 mg via INTRAVENOUS
  Filled 2017-05-28 (×2): qty 50

## 2017-05-28 MED ORDER — CHLORHEXIDINE GLUCONATE 0.12% ORAL RINSE (MEDLINE KIT)
15.0000 mL | Freq: Two times a day (BID) | OROMUCOSAL | Status: DC
  Administered 2017-05-28 – 2017-06-04 (×14): 15 mL via OROMUCOSAL

## 2017-05-28 MED ORDER — ORAL CARE MOUTH RINSE
15.0000 mL | OROMUCOSAL | Status: DC
  Administered 2017-05-28 – 2017-06-04 (×68): 15 mL via OROMUCOSAL

## 2017-05-28 MED ORDER — ARTIFICIAL TEARS OPHTHALMIC OINT
1.0000 "application " | TOPICAL_OINTMENT | Freq: Three times a day (TID) | OPHTHALMIC | Status: DC
  Administered 2017-05-28 – 2017-05-29 (×5): 1 via OPHTHALMIC
  Filled 2017-05-28 (×2): qty 3.5

## 2017-05-28 MED ORDER — INSULIN ASPART 100 UNIT/ML ~~LOC~~ SOLN
2.0000 [IU] | SUBCUTANEOUS | Status: DC
  Administered 2017-05-28: 4 [IU] via SUBCUTANEOUS
  Filled 2017-05-28: qty 1

## 2017-05-28 MED ORDER — ASPIRIN 81 MG PO CHEW: 324.0000 mg | CHEWABLE_TABLET | ORAL | Status: AC

## 2017-05-28 MED ORDER — DEXTROSE 50 % IV SOLN
25.0000 mL | Freq: Once | INTRAVENOUS | Status: AC
Start: 2017-05-28 — End: 2017-05-28
  Administered 2017-05-28: 25 mL via INTRAVENOUS
  Filled 2017-05-28: qty 50

## 2017-05-28 MED ORDER — SODIUM CHLORIDE 0.9 % IV BOLUS (SEPSIS): 500.0000 mL | Freq: Once | INTRAVENOUS | Status: DC

## 2017-05-28 MED ORDER — SODIUM CHLORIDE 0.9 % IV SOLN: 100.0000 ug/h | INTRAVENOUS | Status: DC

## 2017-05-28 MED ORDER — ORAL CARE MOUTH RINSE: 15.0000 mL | Freq: Four times a day (QID) | OROMUCOSAL | Status: DC

## 2017-05-28 MED ORDER — CISATRACURIUM BOLUS VIA INFUSION
3.4000 mg | INTRAVENOUS | Status: DC | PRN
  Filled 2017-05-28: qty 4

## 2017-05-28 MED ORDER — HEPARIN SODIUM (PORCINE) 5000 UNIT/ML IJ SOLN: 5000.0000 [IU] | Freq: Three times a day (TID) | INTRAMUSCULAR | Status: DC

## 2017-05-28 MED ORDER — VANCOMYCIN 50 MG/ML ORAL SOLUTION
500.0000 mg | Freq: Four times a day (QID) | ORAL | Status: DC
  Administered 2017-05-28 (×2): 500 mg via ORAL
  Filled 2017-05-28 (×3): qty 10

## 2017-05-28 MED ORDER — SODIUM CHLORIDE 0.9 % IV SOLN: 250.0000 mL | INTRAVENOUS | Status: DC | PRN

## 2017-05-28 MED ORDER — POTASSIUM CHLORIDE 10 MEQ/50ML IV SOLN
10.0000 meq | INTRAVENOUS | Status: AC
Start: 2017-05-28 — End: 2017-05-28
  Administered 2017-05-28 (×4): 10 meq via INTRAVENOUS
  Filled 2017-05-28 (×4): qty 50

## 2017-05-28 MED ORDER — CHLORHEXIDINE GLUCONATE 0.12% ORAL RINSE (MEDLINE KIT)
15.0000 mL | Freq: Two times a day (BID) | OROMUCOSAL | Status: DC
  Administered 2017-05-28: 15 mL via OROMUCOSAL

## 2017-05-28 MED ORDER — CISATRACURIUM BOLUS VIA INFUSION
7.0000 mg | Freq: Once | INTRAVENOUS | Status: DC
  Filled 2017-05-28: qty 7

## 2017-05-28 MED ORDER — NOREPINEPHRINE BITARTRATE 1 MG/ML IV SOLN: 0.0000 ug/min | INTRAVENOUS | Status: DC

## 2017-05-28 NOTE — H&P (Signed)
Kim Stevens NAME: Kim Stevens    MR#:  [id number]  DATE OF BIRTH:  [birth year]  DATE OF ADMISSION:  05/15/2017  PRIMARY CARE PHYSICIAN: Rudi Coco, MD (Inactive)   REQUESTING/REFERRING PHYSICIAN: Alfred Levins, MD  CHIEF COMPLAINT:   Chief Complaint  Patient presents with  . Cardiac Arrest    HISTORY OF PRESENT ILLNESS:  Kim Stevens  is a 60 y.o. female who presents with PEA arrest.  Patient had CABG procedure done at the end of last month, and subsequent to her procedure during that same hospitalization had a cardiac arrest.  She was resuscitated and recovered and was eventually discharged to rehab.  Today she was found late in the evening around 9:00 or just after by staff at the facility unresponsive.  She was pulseless and CPR was initiated.  EMS arrived and 3 rounds of CPR and epinephrine were administered and ROSC was achieved.  Patient arrived here to the ED and had dense neurologic deficits.  She was intubated without requirement for any sedation.  However, family states that after her last arrest she reacted similarly and remained heavily unresponsive for couple of days and then awoke and recovered.  Cardiology was contacted by ED physician and they recommended against emergent catheterization.  Patient's blood pressure was initially low, but quickly corrected without need for pressors.  Intensivist was contacted and hospitalist were called for admission to ICU.  PAST MEDICAL HISTORY:   Past Medical History:  Diagnosis Date  . Anxiety state 01/22/2011  . Combined forms of age-related cataract of both eyes 03/30/2014  . Diabetes mellitus without complication ([place])   . HTN (hypertension) 08/30/2013  . Hypertension   . Irritable bowel syndrome 01/22/2011  . Microalbuminuria 08/15/2015  . Polyneuropathy associated with underlying disease ([place]) 08/15/2015   Overview:  Gabapentin qhs  . Type 2 diabetes mellitus not at goal  [facility]) 08/15/2015   Overview:  Can no longer go to [facility]. Referred to [facility] 3/17    PAST SURGICAL HISTORY:   Past Surgical History:  Procedure Laterality Date  . CHOLECYSTECTOMY    . COLONOSCOPY WITH PROPOFOL N/A 07/15/2016   Procedure: COLONOSCOPY WITH PROPOFOL;  Surgeon: Lucilla Lame, MD;  Location: [place];  Service: Endoscopy;  Laterality: N/A;  Diabetic - insulin  . EYE SURGERY    . POLYPECTOMY  07/15/2016   Procedure: POLYPECTOMY;  Surgeon: Lucilla Lame, MD;  Location: [place];  Service: Endoscopy;;    SOCIAL HISTORY:   Social History   Tobacco Use  . Smoking status: Never Smoker  . Smokeless tobacco: Never Used  Substance Use Topics  . Alcohol use: Yes    Comment: occasionally: 1-2 drinks/month    FAMILY HISTORY:   Family History  Problem Relation Age of Onset  . Cancer Mother   . Diabetes Father   . Stroke Maternal Grandmother     DRUG ALLERGIES:   Allergies  Allergen Reactions  . Meperidine Nausea And Vomiting       . Oxycodone-Acetaminophen Nausea And Vomiting         MEDICATIONS AT HOME:   Prior to Admission medications   Medication Sig Start Date End Date Taking? Authorizing Provider  acetaminophen (TYLENOL) 325 MG tablet Take 650 mg by mouth every 6 (six) hours as needed for mild pain.   Yes [provider]  albuterol (PROVENTIL) (2.5 MG/3ML) 0.083% nebulizer solution Take 2.5 mg by nebulization 2 (two) times daily as  needed for wheezing or shortness of breath.   Yes [provider]  antiseptic oral rinse (BIOTENE) LIQD 15 mLs by Mouth Rinse route every 2 (two) hours as needed for dry mouth.   Yes [provider]  atorvastatin (LIPITOR) 20 MG tablet Take 20 mg by mouth daily at 6 PM.   Yes [provider]  bisacodyl (DULCOLAX) 10 MG suppository Place 10 mg rectally daily as needed for moderate constipation.   Yes [provider]  cholecalciferol (VITAMIN D) 1000 units tablet Take  1,000 Units by mouth daily.   Yes [provider]  Cold Sore Products (BLISTEX EX) Apply 1 application topically every 2 (two) hours as needed (dry lips).   Yes [provider]  gabapentin (NEURONTIN) 300 MG capsule Take 300 mg by mouth every 6 (six) hours as needed (neuropathy).   Yes [provider]  Glucagon, rDNA, (GLUCAGON EMERGENCY IJ) Inject 0.25 mLs as directed as needed (low blood sugar).   Yes [provider]  heparin 10000 UNIT/ML injection Inject 5,000 Units into the skin every 8 (eight) hours.   Yes [provider]  insulin glargine (LANTUS) 100 UNIT/ML injection Inject 14 Units into the skin at bedtime.    Yes [provider]  insulin lispro (HUMALOG) 100 UNIT/ML injection Inject 0-10 Units into the skin 4 (four) times daily. 0-150= 0 units 151-200= 2 units 201-250= 4 units 251-300= 6 units 301-350= 8 units Greater than 350= 10 units. Recheck in 2 hours. Call MD   Yes [provider]  Lactobacillus Rhamnosus, GG, (CULTURELLE) CAPS Take 1 capsule by mouth daily.   Yes [provider]  levothyroxine (SYNTHROID, LEVOTHROID) 25 MCG tablet Take 25 mcg by mouth daily before breakfast.   Yes [provider]  LORazepam (ATIVAN) 0.5 MG tablet Take 0.5 mg by mouth every 12 (twelve) hours as needed for anxiety.   Yes [provider]  Melatonin 3 MG TABS Take 3 mg by mouth at bedtime.   Yes [provider]  polyethylene glycol (MIRALAX / GLYCOLAX) packet Take 17 g by mouth daily as needed for mild constipation.   Yes [provider]  sildenafil (REVATIO) 20 MG tablet Take 20 mg by mouth 3 (three) times daily.   Yes [provider]  sitaGLIPtin (JANUVIA) 50 MG tablet Take 50 mg by mouth daily.   Yes [provider]  spironolactone (ALDACTONE) 25 MG tablet Take 25 mg by mouth daily.   Yes [provider]  torsemide (DEMADEX) 20 MG tablet Take 40 mg by mouth daily.    Yes [provider]  Vitamin D, Ergocalciferol, (DRISDOL) 50000 units CAPS capsule Take 50,000 Units by mouth every 7 (seven) days.   Yes [provider]  amLODipine (NORVASC) 10 MG tablet Take by mouth. 08/22/15 08/21/16  [provider]  atorvastatin (LIPITOR) 40 MG tablet Take by mouth. 08/15/15 08/14/16  [provider]  lisinopril-hydrochlorothiazide (PRINZIDE,ZESTORETIC) 20-25 MG tablet Take by mouth. 08/15/15 08/14/16  [provider]    REVIEW OF SYSTEMS:  Review of Systems  Unable to perform ROS: Critical illness     VITAL SIGNS:   Vitals:   05/24/2017 2225 05/21/2017 2227 05/28/2017 2230 05/28/17 0000  BP: (!) 72/40 (!) 76/43 (!) 77/43 (!) 152/65  Pulse: 76 77 78 86  Resp: 19 19 20 16   Temp:    (!) 96.1 F (35.6 C)  SpO2: 99% 98% 99% 100%   Wt Readings from Last 3 Encounters:  07/15/16  66.7 kg (147 lb)  07/09/16 68 kg (150 lb)  02/08/16 68.9 kg (152 lb)    PHYSICAL EXAMINATION:  Physical Exam  Vitals reviewed. Constitutional: She appears well-developed and well-nourished. No distress.  HENT:  Head: Normocephalic and atraumatic.  Mouth/Throat: Oropharynx is clear and moist.  Eyes: Conjunctivae and EOM are normal. Pupils are equal, round, and reactive to light. No scleral icterus.  Neck: Normal range of motion. Neck supple. No JVD present. No thyromegaly present.  Cardiovascular: Normal rate, regular rhythm and intact distal pulses. Exam reveals no gallop and no friction rub.  No murmur heard. Respiratory: She is in respiratory distress (Intubated and mechanically ventilated). She has no wheezes. She has no rales.  GI: Soft. Bowel sounds are normal. She exhibits no distension. There is no tenderness.  Musculoskeletal: Normal range of motion. She exhibits no edema.  No arthritis, no gout  Lymphadenopathy:    She has no cervical adenopathy.  Neurological:  Unable to fully assess due to patient condition, however the patient is not  responsive even to painful stimuli.  Skin: Skin is warm and dry. No rash noted. No erythema.  Psychiatric:  Unable to assess due to patient condition    LABORATORY PANEL:   CBC Recent Labs  Lab 05/24/2017 2220  WBC 10.2  HGB 8.1*  HCT 25.5*  PLT 232   ------------------------------------------------------------------------------------------------------------------  Chemistries  Recent Labs  Lab 05/30/2017 2220  NA 131*  K 4.8  CL 96*  CO2 19*  GLUCOSE 220*  BUN 34*  CREATININE 2.24*  CALCIUM 8.3*  AST 54*  ALT 26  ALKPHOS 95  BILITOT 0.4   ------------------------------------------------------------------------------------------------------------------  Cardiac Enzymes Recent Labs  Lab 05/28/2017 2220  TROPONINI 0.03*   ------------------------------------------------------------------------------------------------------------------  RADIOLOGY:  Ct Angio Chest Pe W And/or Wo Contrast  Result Date: 05/13/2017 CLINICAL DATA:  Unresponsive at nursing home, hypotensive EXAM: CT ANGIOGRAPHY CHEST WITH CONTRAST TECHNIQUE: Multidetector CT imaging of the chest was performed using the standard protocol during bolus administration of intravenous contrast. Multiplanar CT image reconstructions and MIPs were obtained to evaluate the vascular anatomy. CONTRAST:  41mL ISOVUE-370 IOPAMIDOL (ISOVUE-370) INJECTION 76% COMPARISON:  05/23/2017 FINDINGS: Cardiovascular: Satisfactory opacification of the pulmonary arteries to the segmental level. No evidence of pulmonary embolism. Atherosclerotic calcifications of the aorta. No aneurysmal dilatation. Post CABG changes. Mild cardiomegaly. Trace pericardial effusion. Coronary vessel calcification. Small foci of venous gas in the left subclavian region. Mediastinum/Nodes: No thyroid mass. Endotracheal tube terminates about 1.6 cm superior to the carina. Esophageal to tip terminates in the stomach. No significantly enlarged mediastinal lymph  nodes. Lungs/Pleura: Small bilateral pleural effusions. Partial consolidations in the lower lobes. Negative for pneumothorax Upper Abdomen: No acute abnormality. Musculoskeletal: Healing right third, fourth rib fractures with possible acute fifth through eighth rib fractures antero laterally. Possible acute left third 7, and eighth anterolateral rib fractures. Sternotomy changes. Small amount of gas within the subcutaneous soft tissues anterior to the xiphoid process, which appears contiguous with sternal wound. No significant fluid collection to suggest soft tissue abscess. Review of the MIP images confirms the above findings. IMPRESSION: 1. Negative for acute pulmonary embolus. 2. Cardiomegaly with post CABG changes.  Trace pericardial fluid. 3. Small pleural effusions. Partial consolidations in the lower lobes, may reflect atelectasis, or mild pneumonia. 4. Small gas collection within the superficial soft tissues anterior to the xiphoid process, may be contiguous with skin surface/sternal wound. No significant fluid collection in this region to suggest soft tissue abscess. Aortic Atherosclerosis (ICD10-I70.0). Electronically Signed  By: Donavan Foil M.D.   On: 06/03/2017 23:17   Dg Chest Portable 1 View  Result Date: 05/16/2017 CLINICAL DATA:  Post intubation. EXAM: PORTABLE CHEST 1 VIEW COMPARISON:  None. FINDINGS: Endotracheal tube tip at the thoracic inlet 2.1 cm from the carina. Low lung volumes. Patient is post median sternotomy. Mild cardiomegaly. Ill-defined perihilar opacities likely pulmonary edema, low lung volumes limit assessment. Streaky opacity in the left mid lung may be atelectasis or scarring. No large pleural effusion or focal airspace disease. No pneumothorax. IMPRESSION: 1. Endotracheal tube tip 2.1 cm from the carina. 2. Low lung volumes. Cardiomegaly post CABG. Probable perihilar edema. Electronically Signed   By: Jeb Levering M.D.   On: 05/23/2017 22:42    EKG:   Orders  placed or performed during the hospital encounter of 06/07/2017  . EKG 12-Lead  . EKG 12-Lead  . EKG 12-Lead  . EKG 12-Lead    IMPRESSION AND PLAN:  Principal Problem:   Cardiac arrest [facility]) -admit patient to ICU, patient is currently being cooled for postarrest protocol, consult intensivist, consult cardiologist Active Problems:   AKI (acute kidney injury) ([place]) -related to her arrest, full supportive treatment as above, monitor for improvement   HTN (hypertension) -currently normotensive to very mildly hypertensive, no antihypertensives at this time, patient also has not needed pressors   Type 2 diabetes mellitus not at goal [facility]) -sliding scale insulin with corresponding glucose checks  All the records are reviewed and case discussed with ED provider. Management plans discussed with the patient and/or family.  DVT PROPHYLAXIS: SubQ heparin  GI PROPHYLAXIS: PPI  ADMISSION STATUS: Inpatient  CODE STATUS: Full Code Status History    This patient does not have a recorded code status. Please follow your organizational policy for patients in this situation.      TOTAL CRITICAL CARE TIME TAKING CARE OF THIS PATIENT: 50 minutes.   Dorianne Perret [place] 05/28/2017, 12:04 AM  [facility]  Office  [phone]  CC: Primary care physician; Rudi Coco, MD (Inactive)  Note:  This document was prepared using Dragon voice recognition software and may include unintentional dictation errors.

## 2017-05-28 NOTE — Progress Notes (Signed)
Per Hinton Dyer, NP 276ml NS bolus given for CVP of 5, decreased UOP, and BP trending downward.    CDS referral called. Spoke with Alvira Philips.  Rexanne Mano to return call.  See Flowsheets for further details.

## 2017-05-28 NOTE — Consult Note (Signed)
[facility] Cardiology  CARDIOLOGY CONSULT NOTE  Patient ID: Kim Stevens MRN: [id number] DOB/AGE: 1955/12/01 61 y.o.  Admit date: 06/02/2017 Referring Physician Bridgett Larsson Primary Physician [facility] Primary Cardiologist Henke Reason for Consultation cardiopulmonary arrest  HPI: 61 year old female referred for for evaluation following cardiopulmonary arrest.  The patient recently underwent CABG 04/28/2017 in [facility].  According to daughter, following CABG, the patient experienced what appeared to be a respiratory arrest, which took several days for recovery.  The patient has been undergoing rehabilitation at [facility].  She was found unresponsive by staff for unknown duration.  The patient was pulseless and CPR was initiated.  Patient was brought to [facility] emergency room where she was she was found to be in PEA arrest with stable rhythm.  The patient was intubated, admitted to the ICU.  Initial troponin was 0.03.  Follow-up troponin 0 0.06.  STEMI team was contacted who felt that cardiac catheterization was not warranted in the absence of ischemic ECG or arrhythmias.  Chest CT was performed which did not reveal evidence for pulmonary embolus.  Head CT did not demonstrate evidence for acute CVA.  Review of systems complete and found to be negative unless listed above     Past Medical History:  Diagnosis Date  . Anxiety state 01/22/2011  . Combined forms of age-related cataract of both eyes 03/30/2014  . Diabetes mellitus without complication ([place])   . HTN (hypertension) 08/30/2013  . Hypertension   . Irritable bowel syndrome 01/22/2011  . Microalbuminuria 08/15/2015  . Polyneuropathy associated with underlying disease ([place]) 08/15/2015   Overview:  Gabapentin qhs  . Type 2 diabetes mellitus not at goal [facility]) 08/15/2015   Overview:  Can no longer go to [facility]. Referred to [facility] 3/17    Past Surgical History:  Procedure Laterality Date  . CHOLECYSTECTOMY    . COLONOSCOPY WITH PROPOFOL N/A  07/15/2016   Procedure: COLONOSCOPY WITH PROPOFOL;  Surgeon: Lucilla Lame, MD;  Location: [place];  Service: Endoscopy;  Laterality: N/A;  Diabetic - insulin  . EYE SURGERY    . POLYPECTOMY  07/15/2016   Procedure: POLYPECTOMY;  Surgeon: Lucilla Lame, MD;  Location: [place];  Service: Endoscopy;;    Medications Prior to Admission  Medication Sig Dispense Refill Last Dose  . acetaminophen (TYLENOL) 325 MG tablet Take 650 mg by mouth every 6 (six) hours as needed for mild pain.   prn at prn  . albuterol (PROVENTIL) (2.5 MG/3ML) 0.083% nebulizer solution Take 2.5 mg by nebulization 2 (two) times daily as needed for wheezing or shortness of breath.   prn at prn  . antiseptic oral rinse (BIOTENE) LIQD 15 mLs by Mouth Rinse route every 2 (two) hours as needed for dry mouth.   prn at prn  . atorvastatin (LIPITOR) 20 MG tablet Take 20 mg by mouth daily at 6 PM.   05/26/2017 at Unknown time  . bisacodyl (DULCOLAX) 10 MG suppository Place 10 mg rectally daily as needed for moderate constipation.   prn at prn  . cholecalciferol (VITAMIN D) 1000 units tablet Take 1,000 Units by mouth daily.   05/21/2017 at Unknown time  . Cold Sore Products (BLISTEX EX) Apply 1 application topically every 2 (two) hours as needed (dry lips).   prn at prn  . gabapentin (NEURONTIN) 300 MG capsule Take 300 mg by mouth every 6 (six) hours as needed (neuropathy).   prn at prn  . Glucagon, rDNA, (GLUCAGON EMERGENCY IJ) Inject 0.25 mLs as directed  as needed (low blood sugar).   prn at prn  . heparin 10000 UNIT/ML injection Inject 5,000 Units into the skin every 8 (eight) hours.   05/26/2017 at Unknown time  . insulin glargine (LANTUS) 100 UNIT/ML injection Inject 14 Units into the skin at bedtime.    05/24/2017 at Unknown time  . insulin lispro (HUMALOG) 100 UNIT/ML injection Inject 0-10 Units into the skin 4 (four) times daily. 0-150= 0 units 151-200= 2 units 201-250= 4 units 251-300= 6 units 301-350= 8  units Greater than 350= 10 units. Recheck in 2 hours. Call MD   05/18/2017 at Unknown time  . Lactobacillus Rhamnosus, GG, (CULTURELLE) CAPS Take 1 capsule by mouth daily.   06/03/2017 at Unknown time  . levothyroxine (SYNTHROID, LEVOTHROID) 25 MCG tablet Take 25 mcg by mouth daily before breakfast.   06/05/2017 at Unknown time  . LORazepam (ATIVAN) 0.5 MG tablet Take 0.5 mg by mouth every 12 (twelve) hours as needed for anxiety.   prn at prn  . Melatonin 3 MG TABS Take 3 mg by mouth at bedtime.   05/26/2017 at Unknown time  . polyethylene glycol (MIRALAX / GLYCOLAX) packet Take 17 g by mouth daily as needed for mild constipation.   prn at prn  . sildenafil (REVATIO) 20 MG tablet Take 20 mg by mouth 3 (three) times daily.   06/07/2017 at 0800  . sitaGLIPtin (JANUVIA) 50 MG tablet Take 50 mg by mouth daily.   05/10/2017 at Unknown time  . spironolactone (ALDACTONE) 25 MG tablet Take 25 mg by mouth daily.   05/12/2017 at Unknown time  . torsemide (DEMADEX) 20 MG tablet Take 40 mg by mouth daily.   05/22/2017 at Unknown time  . Vitamin D, Ergocalciferol, (DRISDOL) 50000 units CAPS capsule Take 50,000 Units by mouth every 7 (seven) days.   05/26/2017 at Unknown time  . amLODipine (NORVASC) 10 MG tablet Take by mouth.   Not Taking at Unknown time  . atorvastatin (LIPITOR) 40 MG tablet Take by mouth.   Not Taking at Unknown time  . lisinopril-hydrochlorothiazide (PRINZIDE,ZESTORETIC) 20-25 MG tablet Take by mouth.   Not Taking at Unknown time   Social History   Socioeconomic History  . Marital status: Married    Spouse name: Not on file  . Number of children: Not on file  . Years of education: Not on file  . Highest education level: Not on file  Social Needs  . Financial resource strain: Not on file  . Food insecurity - worry: Not on file  . Food insecurity - inability: Not on file  . Transportation needs - medical: Not on file  . Transportation needs - non-medical: Not on file  Occupational  History  . Not on file  Tobacco Use  . Smoking status: Never Smoker  . Smokeless tobacco: Never Used  Substance and Sexual Activity  . Alcohol use: Yes    Comment: occasionally: 1-2 drinks/month  . Drug use: No  . Sexual activity: Not on file  Other Topics Concern  . Not on file  Social History Narrative  . Not on file    Family History  Problem Relation Age of Onset  . Cancer Mother   . Diabetes Father   . Stroke Maternal Grandmother       Review of systems complete and found to be negative unless listed above      PHYSICAL EXAM  General: Well developed, well nourished, in no acute distress HEENT:  Normocephalic and atramatic Neck:  No JVD.  Lungs: Clear bilaterally to auscultation and percussion. Heart: HRRR . Normal S1 and S2 without gallops or murmurs.  Abdomen: Bowel sounds are positive, abdomen soft and non-tender  Msk:  Back normal, normal gait. Normal strength and tone for age. Extremities: No clubbing, cyanosis or edema.   Neuro: Alert and oriented X 3. Psych:  Good affect, responds appropriately  Labs:   Lab Results  Component Value Date   WBC 6.3 05/28/2017   HGB 7.8 (L) 05/28/2017   HCT 23.7 (L) 05/28/2017   MCV 90.2 05/28/2017   PLT 204 05/28/2017    Recent Labs  Lab 05/15/2017 2220  05/28/17 0643  NA 131*   < > 131*  K 4.8   < > 2.9*  CL 96*   < > 100*  CO2 19*   < > 20*  BUN 34*   < > 37*  CREATININE 2.24*   < > 2.40*  CALCIUM 8.3*   < > 7.8*  PROT 6.5  --   --   BILITOT 0.4  --   --   ALKPHOS 95  --   --   ALT 26  --   --   AST 54*  --   --   GLUCOSE 220*   < > 122*   < > = values in this interval not displayed.   Lab Results  Component Value Date   TROPONINI 0.05 ([place]) 05/28/2017   No results found for: CHOL No results found for: HDL No results found for: LDLCALC No results found for: TRIG No results found for: CHOLHDL No results found for: LDLDIRECT    Radiology: Ct Head Wo Contrast  Result Date: 05/28/2017 CLINICAL  DATA:  Unresponsive. Altered mental status. Status post recent CABG. EXAM: CT HEAD WITHOUT CONTRAST TECHNIQUE: Contiguous axial images were obtained from the base of the skull through the vertex without intravenous contrast. COMPARISON:  None. FINDINGS: Brain: No mass lesion, intraparenchymal hemorrhage or extra-axial collection. No evidence of acute cortical infarct. Brain parenchyma and CSF-containing spaces are normal for age. Vascular: There is contrast material within the venous sinuses, consistent with the history contrast-enhanced chest CT performed earlier today. Atherosclerotic calcification of the internal carotid arteries at the skull base. Skull: Normal visualized skull base, calvarium and extracranial soft tissues. Sinuses/Orbits: No sinus fluid levels or advanced mucosal thickening. No mastoid effusion. Normal orbits. IMPRESSION: Normal brain. Retained intravascular contrast material from CTA chest performed approximately 4 hours earlier. Electronically Signed   By: Ulyses Jarred M.D.   On: 05/28/2017 01:05   Ct Angio Chest Pe W And/or Wo Contrast  Result Date: 05/25/2017 CLINICAL DATA:  Unresponsive at nursing home, hypotensive EXAM: CT ANGIOGRAPHY CHEST WITH CONTRAST TECHNIQUE: Multidetector CT imaging of the chest was performed using the standard protocol during bolus administration of intravenous contrast. Multiplanar CT image reconstructions and MIPs were obtained to evaluate the vascular anatomy. CONTRAST:  76mL ISOVUE-370 IOPAMIDOL (ISOVUE-370) INJECTION 76% COMPARISON:  05/16/2017 FINDINGS: Cardiovascular: Satisfactory opacification of the pulmonary arteries to the segmental level. No evidence of pulmonary embolism. Atherosclerotic calcifications of the aorta. No aneurysmal dilatation. Post CABG changes. Mild cardiomegaly. Trace pericardial effusion. Coronary vessel calcification. Small foci of venous gas in the left subclavian region. Mediastinum/Nodes: No thyroid mass. Endotracheal tube  terminates about 1.6 cm superior to the carina. Esophageal to tip terminates in the stomach. No significantly enlarged mediastinal lymph nodes. Lungs/Pleura: Small bilateral pleural effusions. Partial consolidations in the lower lobes. Negative for pneumothorax Upper Abdomen: No acute  abnormality. Musculoskeletal: Healing right third, fourth rib fractures with possible acute fifth through eighth rib fractures antero laterally. Possible acute left third 7, and eighth anterolateral rib fractures. Sternotomy changes. Small amount of gas within the subcutaneous soft tissues anterior to the xiphoid process, which appears contiguous with sternal wound. No significant fluid collection to suggest soft tissue abscess. Review of the MIP images confirms the above findings. IMPRESSION: 1. Negative for acute pulmonary embolus. 2. Cardiomegaly with post CABG changes.  Trace pericardial fluid. 3. Small pleural effusions. Partial consolidations in the lower lobes, may reflect atelectasis, or mild pneumonia. 4. Small gas collection within the superficial soft tissues anterior to the xiphoid process, may be contiguous with skin surface/sternal wound. No significant fluid collection in this region to suggest soft tissue abscess. Aortic Atherosclerosis (ICD10-I70.0). Electronically Signed   By: Donavan Foil M.D.   On: 06/02/2017 23:17   Dg Chest Port 1 View  Result Date: 05/28/2017 CLINICAL DATA:  Central catheter placement EXAM: PORTABLE CHEST 1 VIEW COMPARISON:  Chest radiograph and chest CT May 27, 2017 FINDINGS: Central catheter tip is in the right atrium approximately 1 cm distal to the cavoatrial junction. Endotracheal tube tip is 2.3 cm above the carina. Nasogastric tube tip and side port below the diaphragm. No pneumothorax. There is atelectatic change in lung bases. Lungs elsewhere clear. Heart is mildly enlarged with pulmonary vascularity within normal limits. Patient is status post coronary artery bypass grafting.  No adenopathy. No bone lesions. There is aortic atherosclerosis. IMPRESSION: Than catheter positions as described without pneumothorax. Bibasilar atelectasis. Stable cardiac silhouette. There is aortic atherosclerosis. Aortic Atherosclerosis (ICD10-I70.0). Electronically Signed   By: Lowella Grip III M.D.   On: 05/28/2017 02:59   Dg Chest Portable 1 View  Result Date: 05/28/2017 CLINICAL DATA:  Post intubation. EXAM: PORTABLE CHEST 1 VIEW COMPARISON:  None. FINDINGS: Endotracheal tube tip at the thoracic inlet 2.1 cm from the carina. Low lung volumes. Patient is post median sternotomy. Mild cardiomegaly. Ill-defined perihilar opacities likely pulmonary edema, low lung volumes limit assessment. Streaky opacity in the left mid lung may be atelectasis or scarring. No large pleural effusion or focal airspace disease. No pneumothorax. IMPRESSION: 1. Endotracheal tube tip 2.1 cm from the carina. 2. Low lung volumes. Cardiomegaly post CABG. Probable perihilar edema. Electronically Signed   By: Jeb Levering M.D.   On: 05/25/2017 22:42    EKG: Normal sinus rhythm  ASSESSMENT AND PLAN:   1.  Cardiopulmonary arrest, with PEA, currently stabilized after intubation without requiring pressors.  Etiology unclear, appears to be primarily respiratory without evidence for ventricular arrhythmia, or ST elevation MI. 2.  Status post CABG, minimal elevation in troponin, no acute ECG changes 3.  Known history of sleep apnea, patient apparently unable to tolerate CPAP at home 4.  Chronic kidney disease, BUN and creatinine 37 and 2.4, respectively  Recommendations  1.  Agree with current therapy 2.  No indication at this time to proceed with cardiac catheterization 3.  Review 2D echocardiogram 4.  Further recommendations pending echocardiogram results  Signed: Isaias Cowman MD,PhD, [facility] 05/28/2017, 9:31 AM

## 2017-05-28 NOTE — Progress Notes (Addendum)
[facility] [place] NAME: Kim Stevens    MR#:  [id number]  DATE OF BIRTH:  1955/11/25  SUBJECTIVE:  CHIEF COMPLAINT:   Chief Complaint  Patient presents with  . Cardiac Arrest   The patient is on ventilation and sedation. REVIEW OF SYSTEMS:  Review of Systems  Unable to perform ROS: Intubated    DRUG ALLERGIES:   Allergies  Allergen Reactions  . Meperidine Nausea And Vomiting       . Oxycodone-Acetaminophen Nausea And Vomiting        VITALS:  Blood pressure 125/61, pulse (!) 57, temperature (!) 91.6 F (33.1 C), temperature source Bladder, resp. rate 14, height 5\' 4"  (1.626 m), weight 150 lb 12.7 oz (68.4 kg), SpO2 100 %. PHYSICAL EXAMINATION:  Physical Exam  Constitutional:  On ventilation  HENT:  Head: Normocephalic.  Mouth/Throat: Oropharynx is clear and moist.  Eyes: No scleral icterus.  Bilateral pupils are pinpoint.  Neck: Normal range of motion. Neck supple. No JVD present. No tracheal deviation present.  Cardiovascular: Normal rate, regular rhythm and normal heart sounds. Exam reveals no gallop.  No murmur heard. Pulmonary/Chest: Breath sounds normal. No respiratory distress. She has no wheezes. She has no rales.  Abdominal: Soft. Bowel sounds are normal. She exhibits no distension. There is no tenderness. There is no rebound.  Musculoskeletal: She exhibits no edema or tenderness.  Neurological:  Unable to exam.  Skin: No rash noted. No erythema.   LABORATORY PANEL:  Female CBC Recent Labs  Lab 05/28/17 0307  WBC 6.3  HGB 7.8*  HCT 23.7*  PLT 204   ------------------------------------------------------------------------------------------------------------------ Chemistries  Recent Labs  Lab 05/21/2017 2220  05/28/17 1256  NA 131*   < > 131*  K 4.8   < > 3.7  CL 96*   < > 102  CO2 19*   < > 21*  GLUCOSE 220*   < > 104*  BUN 34*   < > 37*  CREATININE 2.24*   < > 2.50*  CALCIUM 8.3*   < > 7.7*    AST 54*  --   --   ALT 26  --   --   ALKPHOS 95  --   --   BILITOT 0.4  --   --    < > = values in this interval not displayed.   RADIOLOGY:  Ct Head Wo Contrast  Result Date: 05/28/2017 CLINICAL DATA:  Unresponsive. Altered mental status. Status post recent CABG. EXAM: CT HEAD WITHOUT CONTRAST TECHNIQUE: Contiguous axial images were obtained from the base of the skull through the vertex without intravenous contrast. COMPARISON:  None. FINDINGS: Brain: No mass lesion, intraparenchymal hemorrhage or extra-axial collection. No evidence of acute cortical infarct. Brain parenchyma and CSF-containing spaces are normal for age. Vascular: There is contrast material within the venous sinuses, consistent with the history contrast-enhanced chest CT performed earlier today. Atherosclerotic calcification of the internal carotid arteries at the skull base. Skull: Normal visualized skull base, calvarium and extracranial soft tissues. Sinuses/Orbits: No sinus fluid levels or advanced mucosal thickening. No mastoid effusion. Normal orbits. IMPRESSION: Normal brain. Retained intravascular contrast material from CTA chest performed approximately 4 hours earlier. Electronically Signed   By: Ulyses Jarred M.D.   On: 05/28/2017 01:05   Ct Angio Chest Pe W And/or Wo Contrast  Result Date: 06/03/2017 CLINICAL DATA:  Unresponsive at nursing home, hypotensive EXAM: CT ANGIOGRAPHY CHEST WITH CONTRAST TECHNIQUE: Multidetector CT imaging of the chest  was performed using the standard protocol during bolus administration of intravenous contrast. Multiplanar CT image reconstructions and MIPs were obtained to evaluate the vascular anatomy. CONTRAST:  19mL ISOVUE-370 IOPAMIDOL (ISOVUE-370) INJECTION 76% COMPARISON:  06/08/2017 FINDINGS: Cardiovascular: Satisfactory opacification of the pulmonary arteries to the segmental level. No evidence of pulmonary embolism. Atherosclerotic calcifications of the aorta. No aneurysmal dilatation.  Post CABG changes. Mild cardiomegaly. Trace pericardial effusion. Coronary vessel calcification. Small foci of venous gas in the left subclavian region. Mediastinum/Nodes: No thyroid mass. Endotracheal tube terminates about 1.6 cm superior to the carina. Esophageal to tip terminates in the stomach. No significantly enlarged mediastinal lymph nodes. Lungs/Pleura: Small bilateral pleural effusions. Partial consolidations in the lower lobes. Negative for pneumothorax Upper Abdomen: No acute abnormality. Musculoskeletal: Healing right third, fourth rib fractures with possible acute fifth through eighth rib fractures antero laterally. Possible acute left third 7, and eighth anterolateral rib fractures. Sternotomy changes. Small amount of gas within the subcutaneous soft tissues anterior to the xiphoid process, which appears contiguous with sternal wound. No significant fluid collection to suggest soft tissue abscess. Review of the MIP images confirms the above findings. IMPRESSION: 1. Negative for acute pulmonary embolus. 2. Cardiomegaly with post CABG changes.  Trace pericardial fluid. 3. Small pleural effusions. Partial consolidations in the lower lobes, may reflect atelectasis, or mild pneumonia. 4. Small gas collection within the superficial soft tissues anterior to the xiphoid process, may be contiguous with skin surface/sternal wound. No significant fluid collection in this region to suggest soft tissue abscess. Aortic Atherosclerosis (ICD10-I70.0). Electronically Signed   By: Donavan Foil M.D.   On: 05/15/2017 23:17   Dg Chest Port 1 View  Result Date: 05/28/2017 CLINICAL DATA:  Central catheter placement EXAM: PORTABLE CHEST 1 VIEW COMPARISON:  Chest radiograph and chest CT May 27, 2017 FINDINGS: Central catheter tip is in the right atrium approximately 1 cm distal to the cavoatrial junction. Endotracheal tube tip is 2.3 cm above the carina. Nasogastric tube tip and side port below the diaphragm. No  pneumothorax. There is atelectatic change in lung bases. Lungs elsewhere clear. Heart is mildly enlarged with pulmonary vascularity within normal limits. Patient is status post coronary artery bypass grafting. No adenopathy. No bone lesions. There is aortic atherosclerosis. IMPRESSION: Than catheter positions as described without pneumothorax. Bibasilar atelectasis. Stable cardiac silhouette. There is aortic atherosclerosis. Aortic Atherosclerosis (ICD10-I70.0). Electronically Signed   By: Lowella Grip III M.D.   On: 05/28/2017 02:59   Dg Chest Portable 1 View  Result Date: 06/09/2017 CLINICAL DATA:  Post intubation. EXAM: PORTABLE CHEST 1 VIEW COMPARISON:  None. FINDINGS: Endotracheal tube tip at the thoracic inlet 2.1 cm from the carina. Low lung volumes. Patient is post median sternotomy. Mild cardiomegaly. Ill-defined perihilar opacities likely pulmonary edema, low lung volumes limit assessment. Streaky opacity in the left mid lung may be atelectasis or scarring. No large pleural effusion or focal airspace disease. No pneumothorax. IMPRESSION: 1. Endotracheal tube tip 2.1 cm from the carina. 2. Low lung volumes. Cardiomegaly post CABG. Probable perihilar edema. Electronically Signed   By: Jeb Levering M.D.   On: 06/07/2017 22:42   ASSESSMENT AND PLAN:   Cardiopulmonary arrest, with PEA, unclear etiology. CAD s/p CABG. Continue ventilator in the sedation.  Currently being cooled for postarrest protocol. Follow-up echo.    AKI (acute kidney injury), ATN -related to her arrest,  Continue IV fluid support in the follow-up BMP.  Hyponatremia.  Continue normal saline IV and follow-up BMP.    HTN (  hypertension), blood pressure is controlled.    Type 2 diabetes mellitus not at goal [facility]) -sliding scale insulin with corresponding glucose checks  Anemia.  Unclear etiology.  Follow-up hemoglobin.  Discussed with ICU nurse practitioner Hinton Dyer. All the records are reviewed and case discussed  with Care Management/Social Worker. Management plans discussed with the patient, family and they are in agreement.  CODE STATUS: Full Code  TOTAL TIME TAKING CARE OF THIS PATIENT: 36 minutes.   More than 50% of the time was spent in counseling/coordination of care: YES  POSSIBLE D/C IN ? DAYS, DEPENDING ON CLINICAL CONDITION.   Demetrios Loll M.D on 05/28/2017 at 2:57 PM  Between 7am to 6pm - Pager - [phone]  After 6pm go to [email or web address] - [facility]

## 2017-05-28 NOTE — Progress Notes (Signed)
MEDICATION RELATED CONSULT NOTE - INITIAL   Pharmacy Consult for CCM for abx  Indication: cardiac arrest  Allergies  Allergen Reactions  . Meperidine Nausea And Vomiting       . Oxycodone-Acetaminophen Nausea And Vomiting         Patient Measurements: Weight: 150 lb 12.7 oz (68.4 kg) Adjusted Body Weight: 68.4 kg  Vital Signs: Temp: 90.7 F (32.6 C) (12/19 0600) Temp Source: Core (12/19 0600) BP: 152/59 (12/19 0600) Pulse Rate: 59 (12/19 0600) Intake/Output from previous day: 12/18 0701 - 12/19 0700 In: 66.1 [I.V.:16.1; IV Piggyback:50] Out: 950 [Urine:750; Stool:200] Intake/Output from this shift: Total I/O In: 66.1 [I.V.:16.1; IV Piggyback:50] Out: 950 [Urine:750; Stool:200]  Labs: Recent Labs    06/07/2017 2220 05/28/17 0307  WBC 10.2 6.3  HGB 8.1* 7.8*  HCT 25.5* 23.7*  PLT 232 204  APTT  --  44*  CREATININE 2.24* 2.26*  ALBUMIN 2.6*  --   PROT 6.5  --   AST 54*  --   ALT 26  --   ALKPHOS 95  --   BILITOT 0.4  --    CrCl cannot be calculated (Unknown ideal weight.).   Microbiology: Recent Results (from the past 720 hour(s))  C difficile quick scan w PCR reflex     Status: Abnormal   Collection Time: 05/28/17 12:03 AM  Result Value Ref Range Status   C Diff antigen POSITIVE (A) NEGATIVE Final   C Diff toxin POSITIVE (A) NEGATIVE Final   C Diff interpretation Toxin producing C. difficile detected.  Final    Comment: CRITICAL RESULT CALLED TO, READ BACK BY AND VERIFIED WITH: Tia Masker AT 0142 05/28/17 ALV   Gastrointestinal Panel by PCR , Stool     Status: None   Collection Time: 05/28/17 12:03 AM  Result Value Ref Range Status   Campylobacter species NOT DETECTED NOT DETECTED Final   Plesimonas shigelloides NOT DETECTED NOT DETECTED Final   Salmonella species NOT DETECTED NOT DETECTED Final   Yersinia enterocolitica NOT DETECTED NOT DETECTED Final   Vibrio species NOT DETECTED NOT DETECTED Final   Vibrio cholerae NOT DETECTED NOT  DETECTED Final   Enteroaggregative E coli (EAEC) NOT DETECTED NOT DETECTED Final   Enteropathogenic E coli (EPEC) NOT DETECTED NOT DETECTED Final   Enterotoxigenic E coli (ETEC) NOT DETECTED NOT DETECTED Final   Shiga like toxin producing E coli (STEC) NOT DETECTED NOT DETECTED Final   Shigella/Enteroinvasive E coli (EIEC) NOT DETECTED NOT DETECTED Final   Cryptosporidium NOT DETECTED NOT DETECTED Final   Cyclospora cayetanensis NOT DETECTED NOT DETECTED Final   Entamoeba histolytica NOT DETECTED NOT DETECTED Final   Giardia lamblia NOT DETECTED NOT DETECTED Final   Adenovirus F40/41 NOT DETECTED NOT DETECTED Final   Astrovirus NOT DETECTED NOT DETECTED Final   Norovirus GI/GII NOT DETECTED NOT DETECTED Final   Rotavirus A NOT DETECTED NOT DETECTED Final   Sapovirus (I, II, IV, and V) NOT DETECTED NOT DETECTED Final  MRSA PCR Screening     Status: None   Collection Time: 05/28/17  3:28 AM  Result Value Ref Range Status   MRSA by PCR NEGATIVE NEGATIVE Final    Comment:        The GeneXpert MRSA Assay (FDA approved for NASAL specimens only), is one component of a comprehensive MRSA colonization surveillance program. It is not intended to diagnose MRSA infection nor to guide or monitor treatment for MRSA infections.     Medical History: Past Medical History:  Diagnosis Date  . Anxiety state 01/22/2011  . Combined forms of age-related cataract of both eyes 03/30/2014  . Diabetes mellitus without complication ([place])   . HTN (hypertension) 08/30/2013  . Hypertension   . Irritable bowel syndrome 01/22/2011  . Microalbuminuria 08/15/2015  . Polyneuropathy associated with underlying disease ([place]) 08/15/2015   Overview:  Gabapentin qhs  . Type 2 diabetes mellitus not at goal [facility]) 08/15/2015   Overview:  Can no longer go to [facility]. Referred to [facility] 3/17    Medications:  Scheduled:  . artificial tears  1 application Both Eyes V4T  . aspirin  300 mg Rectal NOW  . chlorhexidine  gluconate (MEDLINE KIT)  15 mL Mouth Rinse BID  . cisatracurium  7 mg Intravenous Once  . fentaNYL (SUBLIMAZE) injection  100 mcg Intravenous Once  . heparin  5,000 Units Subcutaneous Q8H  . insulin aspart  2-6 Units Subcutaneous Q4H  . mouth rinse  15 mL Mouth Rinse QID  . midazolam  2 mg Intravenous Once  . vancomycin  500 mg Oral Q6H    Assessment: Patient admitted as cardiac arrest s/p cardiac stenting. Patient also tested positive for C. Diff. Pharmacy was consulted for adjustment of abx in critical patient  Plan:  Patient is not on any abx at the moment that require dosage adjustment  Tobie Lords, PharmD, BCPS Clinical Pharmacist 05/28/2017

## 2017-05-28 NOTE — Progress Notes (Signed)
Patient admitted as cardiac arrest on ICE protocol. Patient recently had cardiac stent and may have possibly restenosed.  Patient also tested positive for C. Diff. Patient was ordered famotidine and pantoprazole -- spoke to NP and recommended to d/c pantoprazole considering patient has C. Diff and to increase oral vanc to 500 mg PO q6h vs. 125 mg since patient is critical.  NP notified and agrees with above plan  Tobie Lords, PharmD, BCPS Clinical Pharmacist 05/28/2017

## 2017-05-28 NOTE — Procedures (Signed)
Central Venous Catheter Insertion Procedure Note- Right Internal Jugular Kim Stevens [id number] 11-Oct-1955  Procedure: Insertion of Central Venous Catheter Indications: Assessment of intravascular volume, Drug and/or fluid administration and Frequent blood sampling  Procedure Details Consent: Unable to obtain consent because of emergent medical necessity. Time Out: Verified patient identification, verified procedure, site/side was marked, verified correct patient position, special equipment/implants available, medications/allergies/relevent history reviewed, required imaging and test results available.  Performed  Maximum sterile technique was used including antiseptics, cap, gloves, gown, hand hygiene, mask and sheet. Skin prep: Chlorhexidine; local anesthetic administered A antimicrobial bonded/coated triple lumen catheter was placed in the right internal jugular vein using the Seldinger technique.  Evaluation Blood flow good Complications: No apparent complications Patient did tolerate procedure well. Chest X-ray ordered to verify placement.  CXR: pending.  Procedure performed under direct ultrasound guidance for real time vessel cannulation.     [facility]

## 2017-05-28 NOTE — Consult Note (Signed)
[facility] Nurse wound consult note Reason for Consult: Midline sternal incision, reopened after CPR performed.    Wound type: surgical dehiscence Pressure Injury POA: NA Measurement: 10 cm x 1 cm x 0.4 cm  Wound EMV:VKPQ pink nongranulating Drainage (amount, consistency, odor) minimal serosanguinous Periwound:intact Dressing procedure/placement/frequency: Cleanse sternal wound with NS.  Apply NS moist gauze to wound bed.  Cover with ABD pad and tape. Change daily.   Daughter at bedside.  Discussed low albumin and the role of protein in wound healing.  Nutrition has consulted and recommended tube feeds once patient is re-warmed.  Daughter understands that less than optimal nutrition  delays wound healing and that we will continue to monitor.  Will not follow at this time.  Please re-consult if needed.  Domenic Moras RN BSN [place] Pager [phone]

## 2017-05-28 NOTE — Progress Notes (Signed)
eLink Physician-Brief Progress Note Patient Name: Kim Stevens DOB: [birth year] MRN: [id number]   Date of Service  05/28/2017  HPI/Events of Note  61 yo female with PMH of recent heart surgery. PEA arrest at outside facility. Now intubated and ventilated. PCCM asked to assume care in ICU. VSS. GCS 8 post ROSC. Patient being cooled to 33 degrees F.   eICU Interventions  No new orders.     Intervention Category Evaluation Type: New Patient Evaluation  [name],[name] 05/28/2017, 1:08 AM

## 2017-05-28 NOTE — Progress Notes (Signed)
Pt accepted from ED post cardiac arrest. Pt ruled  In for  hypothrmia protocol. See flow sheet. Hypohtermia wraps applied and cooling started. Pt is unresponsive.

## 2017-05-28 NOTE — ED Notes (Signed)
Nurse trying to obtain rectal tube for insertion.

## 2017-05-28 NOTE — Progress Notes (Signed)
Initial Nutrition Assessment  DOCUMENTATION CODES:   Not applicable  INTERVENTION:  Plan discussed on rounds is to wait until patient is rewarmed before starting enteral nutrition.  Once patient is rewarmed, recommend initiating Vital AF 1.2 at 40 mL/hr (960 mL goal daily volume) + Pro-Stat 30 mL BID via OGT. Provides 1352 kcal, 102 grams of protein, 778 mL H2O daily.  Also recommend liquid MVI daily per tube as goal TF regimen does not meet 100% RDIs for vitamins/minerals.  NUTRITION DIAGNOSIS:   Inadequate oral intake related to inability to eat as evidenced by NPO status.  GOAL:   Provide needs based on ASPEN/SCCM guidelines  MONITOR:   Vent status, Labs, Weight trends, TF tolerance, Skin, I & O's  REASON FOR ASSESSMENT:   Ventilator    ASSESSMENT:   61 year old female with PMHx of DM type 2, IBS, polyneuropathy, anxiety, HTN, hx cholecystectomy, CAD s/p CABG 89/38/1017 complicated by cardiac arrest post-op who was then sent to [facility] for STR. On 12/18 patient was found unresponsive, had suffered unwitnessed cardiopulmonary arrest, s/p 3 rounds of CPR with ROSC and was intubated in the field. Patient was started on hypothermia protocol (33 C) on 12/19 at 0130.   -Chest CT 12/19 negative for pulmonary embolus. -Head CT 12/19 negative for acute CVA. -Plan is for echocardiogram today.  No family members at bedside at time of RD assessment. Weight in chart appears stable.  Access: 16 Fr. OGT placed 12/18; tip and side port are below diaphragm per chest x-ray today (no abdominal x-ray); 53 cm at corner of mouth  MAP: 83-91 mmHg  Patient is currently intubated on ventilator support MV: 5.5 L/min Temp (24hrs), Avg:91.6 F (33.1 C), Min:89.4 F (31.9 C), Max:96.1 F (35.6 C)  Propofol: N/A  Medications reviewed and include: Nimbex gtt, vancomycin, famotidine, fentanyl gtt, Versed gtt, potassium chloride 10 mEq IV 4 times today.  Labs reviewed:  CBG 98-104, Sodium 131, Potassium 2.9, CO2 21, BUN 38, Creatinine 2.39.  Patient does not meet criteria for malnutrition at this time.  Discussed with RN. Also discussed on rounds. Plan is to hold off on initiation of tube feeds until patient is re-warmed.  NUTRITION - FOCUSED PHYSICAL EXAM:    Most Recent Value  Orbital Region  No depletion  Upper Arm Region  No depletion  Thoracic and Lumbar Region  No depletion  Buccal Region  Unable to assess  Temple Region  No depletion  Clavicle Bone Region  No depletion  Clavicle and Acromion Bone Region  No depletion  Scapular Bone Region  Unable to assess  Dorsal Hand  No depletion  Patellar Region  No depletion  Anterior Thigh Region  No depletion  Posterior Calf Region  No depletion  Edema (RD Assessment)  Moderate  Hair  Reviewed  Eyes  Unable to assess  Mouth  Unable to assess  Skin  Reviewed  Nails  Reviewed     Diet Order:  Diet NPO time specified  EDUCATION NEEDS:   No education needs have been identified at this time  Skin:  Skin Assessment: Skin Integrity Issues: Skin Integrity Issues:: Incisions Incisions: open incisions to chest  Last BM:  05/28/2017 - medium type 7 in rectal tube  Height:   Ht Readings from Last 1 Encounters:  05/28/17 5\' 4"  (1.626 m)    Weight:   Wt Readings from Last 1 Encounters:  05/28/17 150 lb 12.7 oz (68.4 kg)    Ideal Body Weight:  54.5 kg  BMI:  Body mass index is 25.88 kg/m.  Estimated Nutritional Needs:   Kcal:  1331 (PSU 2003b w/ MSJ 0354, Ve 5.5, Tmax 37 as pt on cooling protocol)  Protein:  90-110 grams (1.3-1.6 grams/kg)  Fluid:  2-2.4 L/day (30-35 mL/kg)  Willey Blade, MS, RD, LDN Office: [phone] Pager: [phone] After Hours/Weekend Pager: [phone]

## 2017-05-29 ENCOUNTER — Inpatient Hospital Stay: Payer: BC Managed Care – PPO

## 2017-05-29 LAB — BASIC METABOLIC PANEL
ANION GAP: 9 (ref 5–15)
ANION GAP: 11 (ref 5–15)
ANION GAP: 11 (ref 5–15)
Anion gap: 10 (ref 5–15)
Anion gap: 8 (ref 5–15)
Anion gap: 10 (ref 5–15)
BUN: 39 mg/dL — ABNORMAL HIGH (ref 6–20)
BUN: 39 mg/dL — ABNORMAL HIGH (ref 6–20)
BUN: 39 mg/dL — ABNORMAL HIGH (ref 6–20)
BUN: 36 mg/dL — ABNORMAL HIGH (ref 6–20)
BUN: 36 mg/dL — ABNORMAL HIGH (ref 6–20)
BUN: 35 mg/dL — ABNORMAL HIGH (ref 6–20)
CALCIUM: 7.6 mg/dL — AB (ref 8.9–10.3)
CHLORIDE: 103 mmol/L (ref 101–111)
CHLORIDE: 103 mmol/L (ref 101–111)
CHLORIDE: 105 mmol/L (ref 101–111)
CHLORIDE: 105 mmol/L (ref 101–111)
CHLORIDE: 104 mmol/L (ref 101–111)
CO2: 19 mmol/L — AB (ref 22–32)
CO2: 20 mmol/L — AB (ref 22–32)
CO2: 19 mmol/L — AB (ref 22–32)
CO2: 16 mmol/L — AB (ref 22–32)
CO2: 16 mmol/L — AB (ref 22–32)
CO2: 17 mmol/L — ABNORMAL LOW (ref 22–32)
CREATININE: 2.83 mg/dL — AB (ref 0.44–1.00)
CREATININE: 2.9 mg/dL — AB (ref 0.44–1.00)
Calcium: 7.7 mg/dL — ABNORMAL LOW (ref 8.9–10.3)
Calcium: 7.6 mg/dL — ABNORMAL LOW (ref 8.9–10.3)
Calcium: 7 mg/dL — ABNORMAL LOW (ref 8.9–10.3)
Calcium: 7.1 mg/dL — ABNORMAL LOW (ref 8.9–10.3)
Calcium: 7.2 mg/dL — ABNORMAL LOW (ref 8.9–10.3)
Chloride: 105 mmol/L (ref 101–111)
Creatinine, Ser: 3.16 mg/dL — ABNORMAL HIGH (ref 0.44–1.00)
Creatinine, Ser: 3.1 mg/dL — ABNORMAL HIGH (ref 0.44–1.00)
Creatinine, Ser: 3.19 mg/dL — ABNORMAL HIGH (ref 0.44–1.00)
Creatinine, Ser: 3.34 mg/dL — ABNORMAL HIGH (ref 0.44–1.00)
GFR calc Af Amer: 16 mL/min — ABNORMAL LOW (ref >60)
GFR calc non Af Amer: 17 mL/min — ABNORMAL LOW (ref >60)
GFR calc non Af Amer: 17 mL/min — ABNORMAL LOW (ref >60)
GFR calc non Af Amer: 15 mL/min — ABNORMAL LOW (ref >60)
GFR calc non Af Amer: 15 mL/min — ABNORMAL LOW (ref >60)
GFR calc non Af Amer: 15 mL/min — ABNORMAL LOW (ref >60)
GFR calc non Af Amer: 14 mL/min — ABNORMAL LOW (ref >60)
GFR, EST AFRICAN AMERICAN: 20 mL/min — AB (ref >60)
GFR, EST AFRICAN AMERICAN: 19 mL/min — AB (ref >60)
GFR, EST AFRICAN AMERICAN: 17 mL/min — AB (ref >60)
GFR, EST AFRICAN AMERICAN: 18 mL/min — AB (ref >60)
GFR, EST AFRICAN AMERICAN: 17 mL/min — AB (ref >60)
GLUCOSE: 85 mg/dL (ref 65–99)
GLUCOSE: 75 mg/dL (ref 65–99)
GLUCOSE: 105 mg/dL — AB (ref 65–99)
GLUCOSE: 118 mg/dL — AB (ref 65–99)
Glucose, Bld: 120 mg/dL — ABNORMAL HIGH (ref 65–99)
Glucose, Bld: 124 mg/dL — ABNORMAL HIGH (ref 65–99)
POTASSIUM: 3.8 mmol/L (ref 3.5–5.1)
Potassium: 3.4 mmol/L — ABNORMAL LOW (ref 3.5–5.1)
Potassium: 3.3 mmol/L — ABNORMAL LOW (ref 3.5–5.1)
Potassium: 3.2 mmol/L — ABNORMAL LOW (ref 3.5–5.1)
Potassium: 3.8 mmol/L (ref 3.5–5.1)
Potassium: 4.1 mmol/L (ref 3.5–5.1)
Sodium: 132 mmol/L — ABNORMAL LOW (ref 135–145)
Sodium: 131 mmol/L — ABNORMAL LOW (ref 135–145)
Sodium: 133 mmol/L — ABNORMAL LOW (ref 135–145)
Sodium: 132 mmol/L — ABNORMAL LOW (ref 135–145)
Sodium: 131 mmol/L — ABNORMAL LOW (ref 135–145)
Sodium: 132 mmol/L — ABNORMAL LOW (ref 135–145)

## 2017-05-29 LAB — ECHOCARDIOGRAM COMPLETE
HEIGHTINCHES: 64 in
Weight: 2412.71 oz

## 2017-05-29 LAB — GLUCOSE, CAPILLARY
GLUCOSE-CAPILLARY: 112 mg/dL — AB (ref 65–99)
GLUCOSE-CAPILLARY: 132 mg/dL — AB (ref 65–99)
GLUCOSE-CAPILLARY: 89 mg/dL (ref 65–99)
GLUCOSE-CAPILLARY: 94 mg/dL (ref 65–99)
Glucose-Capillary: 102 mg/dL — ABNORMAL HIGH (ref 65–99)
Glucose-Capillary: 105 mg/dL — ABNORMAL HIGH (ref 65–99)
Glucose-Capillary: 115 mg/dL — ABNORMAL HIGH (ref 65–99)
Glucose-Capillary: 116 mg/dL — ABNORMAL HIGH (ref 65–99)
Glucose-Capillary: 117 mg/dL — ABNORMAL HIGH (ref 65–99)
Glucose-Capillary: 119 mg/dL — ABNORMAL HIGH (ref 65–99)
Glucose-Capillary: 119 mg/dL — ABNORMAL HIGH (ref 65–99)
Glucose-Capillary: 122 mg/dL — ABNORMAL HIGH (ref 65–99)
Glucose-Capillary: 125 mg/dL — ABNORMAL HIGH (ref 65–99)
Glucose-Capillary: 128 mg/dL — ABNORMAL HIGH (ref 65–99)
Glucose-Capillary: 129 mg/dL — ABNORMAL HIGH (ref 65–99)
Glucose-Capillary: 64 mg/dL — ABNORMAL LOW (ref 65–99)
Glucose-Capillary: 73 mg/dL (ref 65–99)
Glucose-Capillary: 78 mg/dL (ref 65–99)
Glucose-Capillary: 83 mg/dL (ref 65–99)

## 2017-05-29 LAB — URINALYSIS, COMPLETE (UACMP) WITH MICROSCOPIC
BACTERIA UA: NONE SEEN
BILIRUBIN URINE: NEGATIVE
Glucose, UA: 50 mg/dL — AB
KETONES UR: NEGATIVE mg/dL
NITRITE: POSITIVE — AB
PROTEIN: 100 mg/dL — AB
Specific Gravity, Urine: 1.018 (ref 1.005–1.030)
pH: 5 (ref 5.0–8.0)

## 2017-05-29 LAB — CBC
HEMATOCRIT: 25 % — AB (ref 35.0–47.0)
HEMOGLOBIN: 8.2 g/dL — AB (ref 12.0–16.0)
MCH: 29.3 pg (ref 26.0–34.0)
MCHC: 32.8 g/dL (ref 32.0–36.0)
MCV: 89.4 fL (ref 80.0–100.0)
Platelets: 245 10*3/uL (ref 150–440)
RBC: 2.8 MIL/uL — ABNORMAL LOW (ref 3.80–5.20)
RDW: 16.9 % — AB (ref 11.5–14.5)
WBC: 10.4 10*3/uL (ref 3.6–11.0)

## 2017-05-29 LAB — HIV ANTIBODY (ROUTINE TESTING W REFLEX): HIV Screen 4th Generation wRfx: NONREACTIVE

## 2017-05-29 LAB — PROCALCITONIN: PROCALCITONIN: 69.86 ng/mL

## 2017-05-29 MED ORDER — DEXTROSE 50 % IV SOLN
INTRAVENOUS | Status: AC
  Administered 2017-05-29: 50 mL via INTRAVENOUS
  Filled 2017-05-29: qty 50

## 2017-05-29 MED ORDER — POTASSIUM CHLORIDE 2 MEQ/ML IV SOLN
INTRAVENOUS | Status: DC
  Administered 2017-05-29 – 2017-05-30 (×3): via INTRAVENOUS
  Filled 2017-05-29 (×5): qty 1000

## 2017-05-29 MED ORDER — SODIUM CHLORIDE 0.9% FLUSH
10.0000 mL | INTRAVENOUS | Status: DC | PRN
  Administered 2017-06-01: 10 mL
  Filled 2017-05-29: qty 40

## 2017-05-29 MED ORDER — METRONIDAZOLE IN NACL 5-0.79 MG/ML-% IV SOLN
500.0000 mg | Freq: Three times a day (TID) | INTRAVENOUS | Status: DC
  Administered 2017-05-29 – 2017-06-02 (×11): 500 mg via INTRAVENOUS
  Filled 2017-05-29 (×15): qty 100

## 2017-05-29 MED ORDER — FAMOTIDINE IN NACL 20-0.9 MG/50ML-% IV SOLN: 20.0000 mg | Freq: Every day | INTRAVENOUS | Status: DC

## 2017-05-29 MED ORDER — DEXTROSE 5 % IV SOLN: 1.0000 g | INTRAVENOUS | Status: DC

## 2017-05-29 MED ORDER — DEXTROSE 5 % IV SOLN
1.0000 g | INTRAVENOUS | Status: DC
  Administered 2017-05-29 – 2017-06-01 (×4): 1 g via INTRAVENOUS
  Filled 2017-05-29 (×5): qty 1

## 2017-05-29 MED ORDER — DEXTROSE 50 % IV SOLN
1.0000 | Freq: Once | INTRAVENOUS | Status: AC
  Administered 2017-05-29: 50 mL via INTRAVENOUS

## 2017-05-29 MED ORDER — POTASSIUM CHLORIDE 2 MEQ/ML IV SOLN: INTRAVENOUS | Status: DC

## 2017-05-29 MED ORDER — SODIUM CHLORIDE 0.9 % IV BOLUS (SEPSIS)
1000.0000 mL | Freq: Once | INTRAVENOUS | Status: AC
  Administered 2017-05-29: 1000 mL via INTRAVENOUS

## 2017-05-29 MED ORDER — FAMOTIDINE 20 MG PO TABS
20.0000 mg | ORAL_TABLET | Freq: Every day | ORAL | Status: DC
  Administered 2017-05-29 – 2017-06-02 (×5): 20 mg
  Filled 2017-05-29 (×5): qty 1

## 2017-05-29 MED ORDER — DEXTROSE 5 % IV SOLN
1.0000 g | INTRAVENOUS | Status: DC
  Filled 2017-05-29: qty 1

## 2017-05-29 NOTE — Progress Notes (Signed)
[facility] Cardiology  SUBJECTIVE: Patient intubated, unresponsive   Vitals:   05/29/17 0630 05/29/17 0700 05/29/17 0730 05/29/17 0800  BP: (!) 120/47 (!) 109/48 (!) 89/43 (!) 195/66  Pulse: 66 67 64 76  Resp: 19 17 (!) 8 (!) 21  Temp:  (!) 94.8 F (34.9 C)  (!) 94.6 F (34.8 C)  TempSrc:  Bladder  Bladder  SpO2: 100% 100% 100% 100%  Weight:      Height:         Intake/Output Summary (Last 24 hours) at 05/29/2017 0813 Last data filed at 05/29/2017 0800 Gross per 24 hour  Intake 1344.29 ml  Output 960 ml  Net 384.29 ml      PHYSICAL EXAM  General: Intubated  HEENT: Intubated Neck:  No JVD.  Lungs: Clear bilaterally to auscultation and percussion. Heart: HRRR . Normal S1 and S2 without gallops or murmurs.  Abdomen: Bowel sounds are positive, abdomen soft and non-tender  Msk:  Back normal, normal gait. Normal strength and tone for age. Extremities: No clubbing, cyanosis or edema.   Neuro: Unresponsive Psych:  Unresponsive   LABS: Basic Metabolic Panel: Recent Labs    05/29/17 0118 05/29/17 0538  NA 132* 131*  K 3.4* 3.3*  CL 103 103  CO2 19* 20*  GLUCOSE 85 75  BUN 39* 39*  CREATININE 2.83* 2.90*  CALCIUM 7.6* 7.7*   Liver Function Tests: Recent Labs    05/24/2017 2220  AST 54*  ALT 26  ALKPHOS 95  BILITOT 0.4  PROT 6.5  ALBUMIN 2.6*   No results for input(s): LIPASE, AMYLASE in the last 72 hours. CBC: Recent Labs    05/26/2017 2220 05/28/17 0307 05/29/17 0416  WBC 10.2 6.3 10.4  NEUTROABS 7.3*  --   --   HGB 8.1* 7.8* 8.2*  HCT 25.5* 23.7* 25.0*  MCV 93.2 90.2 89.4  PLT 232 204 245   Cardiac Enzymes: Recent Labs    05/28/17 0643 05/28/17 1256 05/28/17 1756  TROPONINI 0.05* 0.04* 0.04*   BNP: Invalid input(s): POCBNP D-Dimer: No results for input(s): DDIMER in the last 72 hours. Hemoglobin A1C: No results for input(s): HGBA1C in the last 72 hours. Fasting Lipid Panel: No results for input(s): CHOL, HDL, LDLCALC, TRIG, CHOLHDL,  LDLDIRECT in the last 72 hours. Thyroid Function Tests: No results for input(s): TSH, T4TOTAL, T3FREE, THYROIDAB in the last 72 hours.  Invalid input(s): FREET3 Anemia Panel: No results for input(s): VITAMINB12, FOLATE, FERRITIN, TIBC, IRON, RETICCTPCT in the last 72 hours.  Ct Head Wo Contrast  Result Date: 05/28/2017 CLINICAL DATA:  Unresponsive. Altered mental status. Status post recent CABG. EXAM: CT HEAD WITHOUT CONTRAST TECHNIQUE: Contiguous axial images were obtained from the base of the skull through the vertex without intravenous contrast. COMPARISON:  None. FINDINGS: Brain: No mass lesion, intraparenchymal hemorrhage or extra-axial collection. No evidence of acute cortical infarct. Brain parenchyma and CSF-containing spaces are normal for age. Vascular: There is contrast material within the venous sinuses, consistent with the history contrast-enhanced chest CT performed earlier today. Atherosclerotic calcification of the internal carotid arteries at the skull base. Skull: Normal visualized skull base, calvarium and extracranial soft tissues. Sinuses/Orbits: No sinus fluid levels or advanced mucosal thickening. No mastoid effusion. Normal orbits. IMPRESSION: Normal brain. Retained intravascular contrast material from CTA chest performed approximately 4 hours earlier. Electronically Signed   By: Ulyses Jarred M.D.   On: 05/28/2017 01:05   Ct Angio Chest Pe W And/or Wo Contrast  Result Date: 06/09/2017 CLINICAL DATA:  Unresponsive at nursing home, hypotensive EXAM: CT ANGIOGRAPHY CHEST WITH CONTRAST TECHNIQUE: Multidetector CT imaging of the chest was performed using the standard protocol during bolus administration of intravenous contrast. Multiplanar CT image reconstructions and MIPs were obtained to evaluate the vascular anatomy. CONTRAST:  38mL ISOVUE-370 IOPAMIDOL (ISOVUE-370) INJECTION 76% COMPARISON:  05/23/2017 FINDINGS: Cardiovascular: Satisfactory opacification of the pulmonary  arteries to the segmental level. No evidence of pulmonary embolism. Atherosclerotic calcifications of the aorta. No aneurysmal dilatation. Post CABG changes. Mild cardiomegaly. Trace pericardial effusion. Coronary vessel calcification. Small foci of venous gas in the left subclavian region. Mediastinum/Nodes: No thyroid mass. Endotracheal tube terminates about 1.6 cm superior to the carina. Esophageal to tip terminates in the stomach. No significantly enlarged mediastinal lymph nodes. Lungs/Pleura: Small bilateral pleural effusions. Partial consolidations in the lower lobes. Negative for pneumothorax Upper Abdomen: No acute abnormality. Musculoskeletal: Healing right third, fourth rib fractures with possible acute fifth through eighth rib fractures antero laterally. Possible acute left third 7, and eighth anterolateral rib fractures. Sternotomy changes. Small amount of gas within the subcutaneous soft tissues anterior to the xiphoid process, which appears contiguous with sternal wound. No significant fluid collection to suggest soft tissue abscess. Review of the MIP images confirms the above findings. IMPRESSION: 1. Negative for acute pulmonary embolus. 2. Cardiomegaly with post CABG changes.  Trace pericardial fluid. 3. Small pleural effusions. Partial consolidations in the lower lobes, may reflect atelectasis, or mild pneumonia. 4. Small gas collection within the superficial soft tissues anterior to the xiphoid process, may be contiguous with skin surface/sternal wound. No significant fluid collection in this region to suggest soft tissue abscess. Aortic Atherosclerosis (ICD10-I70.0). Electronically Signed   By: Donavan Foil M.D.   On: 06/01/2017 23:17   Dg Chest Port 1 View  Result Date: 05/29/2017 CLINICAL DATA:  Respiratory failure.  Ventilator support. EXAM: PORTABLE CHEST 1 VIEW COMPARISON:  05/28/2017 FINDINGS: Endotracheal tube tip is 2 cm above the carina. Nasogastric tube enters the abdomen. Right  internal jugular central line tip is in the right atrium. Patchy volume loss at the right base persists. There is worsening of volume loss in the left lower lobe. IMPRESSION: Worsened volume loss in the left lower lobe. Central line tip again noted to be in the right atrium. Electronically Signed   By: Nelson Chimes M.D.   On: 05/29/2017 06:33   Dg Chest Port 1 View  Result Date: 05/28/2017 CLINICAL DATA:  Central catheter placement EXAM: PORTABLE CHEST 1 VIEW COMPARISON:  Chest radiograph and chest CT May 27, 2017 FINDINGS: Central catheter tip is in the right atrium approximately 1 cm distal to the cavoatrial junction. Endotracheal tube tip is 2.3 cm above the carina. Nasogastric tube tip and side port below the diaphragm. No pneumothorax. There is atelectatic change in lung bases. Lungs elsewhere clear. Heart is mildly enlarged with pulmonary vascularity within normal limits. Patient is status post coronary artery bypass grafting. No adenopathy. No bone lesions. There is aortic atherosclerosis. IMPRESSION: Than catheter positions as described without pneumothorax. Bibasilar atelectasis. Stable cardiac silhouette. There is aortic atherosclerosis. Aortic Atherosclerosis (ICD10-I70.0). Electronically Signed   By: Lowella Grip III M.D.   On: 05/28/2017 02:59   Dg Chest Portable 1 View  Result Date: 05/25/2017 CLINICAL DATA:  Post intubation. EXAM: PORTABLE CHEST 1 VIEW COMPARISON:  None. FINDINGS: Endotracheal tube tip at the thoracic inlet 2.1 cm from the carina. Low lung volumes. Patient is post median sternotomy. Mild cardiomegaly. Ill-defined perihilar opacities likely pulmonary edema, low lung  volumes limit assessment. Streaky opacity in the left mid lung may be atelectasis or scarring. No large pleural effusion or focal airspace disease. No pneumothorax. IMPRESSION: 1. Endotracheal tube tip 2.1 cm from the carina. 2. Low lung volumes. Cardiomegaly post CABG. Probable perihilar edema.  Electronically Signed   By: Jeb Levering M.D.   On: 05/23/2017 22:42     Echo pending  TELEMETRY: Normal sinus rhythm:  ASSESSMENT AND PLAN:  Principal Problem:   Cardiac arrest ([place]) Active Problems:   HTN (hypertension)   Type 2 diabetes mellitus not at goal [facility])   AKI (acute kidney injury) ([place])   PEA (Pulseless electrical activity) ([place])    1. Cardiopulmonary arrest, unknown duration, PEA on presentation, status post CPR, without ischemic ECG changes, minimal troponin elevation ( 0.05, 0.04, 0.04 ), likely noncardiac, with resultant anoxic encephalopathy 2. Status post CABG 76/81/1572, complicated by similar respiratory arrest 3. Acute kidney injury, likely ATN, BUN and creatinine 39 and 2.9, respectively 4. Known sleep apnea, unable to tolerate CPAP at home  Recommendations  1. Agree with current therapy 2. No indication for cardiac catheterization 3. Review 2-D echocardiogram 4. No further specific recommendations at this time  Sign off for now, please call if any questions   Isaias Cowman, MD, PhD, [facility] 05/29/2017 8:13 AM

## 2017-05-29 NOTE — Progress Notes (Signed)
Pt was transported to CT from CCU and back while on the ventilator.

## 2017-05-29 NOTE — Progress Notes (Signed)
PULMONARY / CRITICAL CARE MEDICINE   Name: Kim Stevens MRN: [id number] DOB: 09-12-1955    HISTORY OF PRESENT ILLNESS:  Kim Stevens is a 61 year old female with known history of anxiety, diabetes mellitus, hypertension, IBS and CAD status post CABG on November 19.  Her hospital course was complicated by cardiac arrest.  Thereafter she was sent to [facility] for rehab.  On 12/18 around 9 PM patient was found to be unresponsive.  EMS was called.  When EMS arrived on the scene patient was pulseless and in asystole.  She received 1 round of epi and 3 rounds of CPR with ROSC.  She had King airway placed by the EMS which was exchanged with an ET tube without any neurological activity.  Her GCS was noted to be 3 and initial EKG rhythm was negative for STEMI. Patient was admitted by the hospitalist.  CCM team was consulted for further management hypothermia protocol initiated at 33 degrees C on 05/28/17.     REVIEW OF SYSTEMS:   Unable to obtain pt intubated   SUBJECTIVE:  Unable to obtain pt intubated   VITAL SIGNS: BP (!) 91/40   Pulse 68   Temp (!) 95 F (35 C) (Bladder)   Resp (!) 25   Ht 5\' 4"  (1.626 m)   Wt 67.1 kg (147 lb 14.9 oz)   SpO2 100%   BMI 25.39 kg/m   HEMODYNAMICS: CVP:  [3 mmHg-13 mmHg] 6 mmHg  VENTILATOR SETTINGS: Vent Mode: PRVC FiO2 (%):  [30 %] 30 % Set Rate:  [14 bmp] 14 bmp Vt Set:  [400 mL] 400 mL PEEP:  [5 cmH20] 5 cmH20  INTAKE / OUTPUT: I/O last 3 completed shifts: In: 1423.6 [I.V.:432; Other:90; NG/GT:160; IV Piggyback:741.7] Out: 1910 [Urine:910; Stool:1000]  PHYSICAL EXAMINATION: General: 61 year old female, intubated and on mechanical ventilation Neuro: Unresponsive currently receiving paralytic HEENT: AT, , pupils 2 nonreactive Cardiovascular: S1-S2, sinus rhythm, no M/R/G Lungs: Diminished throughout, no wheezes, crackles or rhonchi Abdomen: Soft, active bowel sounds, non distended  Musculoskeletal: No edema, cyanosis Skin: Chest  incision covered with dressings, CDI  LABS:  BMET Recent Labs  Lab 05/28/17 2019 05/29/17 0118 05/29/17 0538  NA 129* 132* 131*  K 3.5 3.4* 3.3*  CL 105 103 103  CO2 20* 19* 20*  BUN 37* 39* 39*  CREATININE 2.58* 2.83* 2.90*  GLUCOSE 85 85 75    Electrolytes Recent Labs  Lab 05/28/17 2019 05/29/17 0118 05/29/17 0538  CALCIUM 7.1* 7.6* 7.7*    CBC Recent Labs  Lab 05/18/2017 2220 05/28/17 0307 05/29/17 0416  WBC 10.2 6.3 10.4  HGB 8.1* 7.8* 8.2*  HCT 25.5* 23.7* 25.0*  PLT 232 204 245    Coag's Recent Labs  Lab 05/28/17 0307 05/28/17 0854  APTT 44* 40*  INR 1.10 1.15    Sepsis Markers Recent Labs  Lab 05/28/17 0307 05/28/17 0308 05/29/17 0416  LATICACIDVEN  --  0.7  --   PROCALCITON 15.05  --  69.86    ABG Recent Labs  Lab 06/02/2017 2319 05/28/17 0408  PHART 7.37 7.53*  PCO2ART 33 28*  PO2ART 63* 123*    Liver Enzymes Recent Labs  Lab 05/13/2017 2220  AST 54*  ALT 26  ALKPHOS 95  BILITOT 0.4  ALBUMIN 2.6*    Cardiac Enzymes Recent Labs  Lab 05/28/17 0643 05/28/17 1256 05/28/17 1756  TROPONINI 0.05* 0.04* 0.04*    Glucose Recent Labs  Lab 05/29/17 0124 05/29/17 0203 05/29/17 0304 05/29/17 0420  05/29/17 0547 05/29/17 0800  GLUCAP 73 78 89 83 64* 132*    Imaging Dg Chest Port 1 View  Result Date: 05/29/2017 CLINICAL DATA:  Respiratory failure.  Ventilator support. EXAM: PORTABLE CHEST 1 VIEW COMPARISON:  05/28/2017 FINDINGS: Endotracheal tube tip is 2 cm above the carina. Nasogastric tube enters the abdomen. Right internal jugular central line tip is in the right atrium. Patchy volume loss at the right base persists. There is worsening of volume loss in the left lower lobe. IMPRESSION: Worsened volume loss in the left lower lobe. Central line tip again noted to be in the right atrium. Electronically Signed   By: Nelson Chimes M.D.   On: 05/29/2017 06:33   STUDIES:  06/02/2017 CTPE>>Negative for acute pulmonary embolus..  Cardiomegaly with post CABG changes.  Trace pericardial fluid. Small pleural effusions. Partial consolidations in the lower lobes, may reflect atelectasis, or mild pneumonia.. Small gas collection within the superficial soft tissues anteriorto the xiphoid process, may be contiguous with skin surface/sternalwound. No significant fluid collection in this region to suggestsoft tissue abscess. 12/19 CT head>> Normal brain 1128/2018 ECHO>> LVEF 45%  CULTURES: Blood culture 12/19>>negative Sputum Culture 12/19>> GI Panel 12/19>>negative  Cdiff PCR 12/19>>positive   ANTIBIOTICS: Vancomycin 12/19>> Metronidazole 12/20>> Cefepime 12/20>>  SIGNIFICANT EVENTS: 11/19 CABG at [facility] 12/12 PEA arrest 12/19 PEA arrest mechanically intubated hypothermic protocol initiated at 33 degrees C  LINES/TUBES: 12/19 ET tube 12/19 Right IJ CVL   ASSESSMENT / PLAN:  PULMONARY A: Acute Respiratory failure s/p PEA arrest  Hx of Pulmonary Hypertension P:   Full vent support VAP bundle Implemented SBT trials when tolerated CXR in am  CARDIOVASCULAR A:  PEA arrest Hypotension likely component of hypovolemia and sepsis  CAD S/P CABG P:  Continuous telemetry monitoring  Keep MAP goal>65 Will bolus 1L NS and start D5LR with 40 meq KCL @100  ml/hr-12/20 Cardiology consulted appreciate input   RENAL A:   Acute Kidney Injury P:   Strict I/O Follow BMP Replace electrolytes Monitor UOP   GASTROINTESTINAL A:   C-diff P:   Continue vancomycin will add metronidazole Famotidine for GIP Keep NPO for now   HEMATOLOGIC A:   Anemia without acute blood loss  P: Subq heparin for VTE prophylaxis  Trend CBC  Monitor for s/sx of bleeding  Will transfuse if Hgb<7   INFECTIOUS A:   C-diff positive P:   Trend WBC and monitor fever curve Trend PCT Follow cultures Continue abx as listed above  ENDOCRINE A:   DM P:   Blood Glucose checks with SSI coverage  NEUROLOGIC A:    Severe Encephalopathy Post cardiac arrest P:   Once rewarmed maintain RASS goal 0 to -1 Currently rewarming will discontinue nimbex once core temp @goal   Repeat CT Head once rewarming complete   FAMILY  - Updates: No family at bedside to update at this time once they arrive will update.  Marda Stalker, [place] Pager [phone] (please enter 7 digits) PCCM Consult Pager [phone] (please enter 7 digits)

## 2017-05-29 NOTE — Progress Notes (Signed)
Per Bin[place], NP Levophed started for patients decreased BP and 554ml bolus of LR given as well. Also gave 33ml amp of D50 for patients CBG of 70.

## 2017-05-29 NOTE — Progress Notes (Signed)
[place] [facility] [place] NAME: Kim Stevens    MR#:  [id number]  DATE OF BIRTH:  1956-04-15  SUBJECTIVE:  CHIEF COMPLAINT:   Chief Complaint  Patient presents with  . Cardiac Arrest   - cardiac arrest, recent CABG 4 weeks ago - on low dose pressors, sedation as well- hypothermia protocol  REVIEW OF SYSTEMS:  Review of Systems  Unable to perform ROS: Critical illness  Intubated and sedated  DRUG ALLERGIES:   Allergies  Allergen Reactions  . Meperidine Nausea And Vomiting       . Oxycodone-Acetaminophen Nausea And Vomiting         VITALS:  Blood pressure (!) 160/50, pulse 71, temperature (!) 94.6 F (34.8 C), temperature source Bladder, resp. rate (!) 25, height 5\' 4"  (1.626 m), weight 67.1 kg (147 lb 14.9 oz), SpO2 100 %.  PHYSICAL EXAMINATION:  Physical Exam  GENERAL:  61 y.o.-year-old patient lying in the bed, critically ill appearing.  EYES: Pupils pinpoint with sliggish reactive to light. No scleral icterus.  HEENT: Head atraumatic, normocephalic. Oropharynx and nasopharynx clear.  NECK:  Supple, no jugular venous distention. No thyroid enlargement, no tenderness.  LUNGS: Normal breath sounds bilaterally, no wheezing, rales,rhonchi or crepitation. No use of accessory muscles of respiration. Decreased at the bases CARDIOVASCULAR: S1, S2 normal. No murmurs, rubs, or gallops.  CABG incision with gaping noted- dressing in place- no purulence noted. ABDOMEN: Soft, nontender, nondistended. Hypoactive Bowel sounds present. No organomegaly or mass. Cooling gel pads on the abdomen EXTREMITIES: No pedal edema, cyanosis, or clubbing.  NEUROLOGIC: sedated currently  PSYCHIATRIC: The patient is sedated.  SKIN: No obvious rash, lesion, or ulcer.    LABORATORY PANEL:   CBC Recent Labs  Lab 05/29/17 0416  WBC 10.4  HGB 8.2*  HCT 25.0*  PLT 245    ------------------------------------------------------------------------------------------------------------------  Chemistries  Recent Labs  Lab 05/12/2017 2220  05/29/17 0538  NA 131*   < > 131*  K 4.8   < > 3.3*  CL 96*   < > 103  CO2 19*   < > 20*  GLUCOSE 220*   < > 75  BUN 34*   < > 39*  CREATININE 2.24*   < > 2.90*  CALCIUM 8.3*   < > 7.7*  AST 54*  --   --   ALT 26  --   --   ALKPHOS 95  --   --   BILITOT 0.4  --   --    < > = values in this interval not displayed.   ------------------------------------------------------------------------------------------------------------------  Cardiac Enzymes Recent Labs  Lab 05/28/17 1756  TROPONINI 0.04*   ------------------------------------------------------------------------------------------------------------------  RADIOLOGY:  Ct Head Wo Contrast  Result Date: 05/28/2017 CLINICAL DATA:  Unresponsive. Altered mental status. Status post recent CABG. EXAM: CT HEAD WITHOUT CONTRAST TECHNIQUE: Contiguous axial images were obtained from the base of the skull through the vertex without intravenous contrast. COMPARISON:  None. FINDINGS: Brain: No mass lesion, intraparenchymal hemorrhage or extra-axial collection. No evidence of acute cortical infarct. Brain parenchyma and CSF-containing spaces are normal for age. Vascular: There is contrast material within the venous sinuses, consistent with the history contrast-enhanced chest CT performed earlier today. Atherosclerotic calcification of the internal carotid arteries at the skull base. Skull: Normal visualized skull base, calvarium and extracranial soft tissues. Sinuses/Orbits: No sinus fluid levels or advanced mucosal thickening. No mastoid effusion. Normal orbits. IMPRESSION: Normal brain. Retained intravascular contrast material from CTA chest  performed approximately 4 hours earlier. Electronically Signed   By: Ulyses Jarred M.D.   On: 05/28/2017 01:05   Ct Angio Chest Pe W And/or  Wo Contrast  Result Date: 05/14/2017 CLINICAL DATA:  Unresponsive at nursing home, hypotensive EXAM: CT ANGIOGRAPHY CHEST WITH CONTRAST TECHNIQUE: Multidetector CT imaging of the chest was performed using the standard protocol during bolus administration of intravenous contrast. Multiplanar CT image reconstructions and MIPs were obtained to evaluate the vascular anatomy. CONTRAST:  45mL ISOVUE-370 IOPAMIDOL (ISOVUE-370) INJECTION 76% COMPARISON:  05/18/2017 FINDINGS: Cardiovascular: Satisfactory opacification of the pulmonary arteries to the segmental level. No evidence of pulmonary embolism. Atherosclerotic calcifications of the aorta. No aneurysmal dilatation. Post CABG changes. Mild cardiomegaly. Trace pericardial effusion. Coronary vessel calcification. Small foci of venous gas in the left subclavian region. Mediastinum/Nodes: No thyroid mass. Endotracheal tube terminates about 1.6 cm superior to the carina. Esophageal to tip terminates in the stomach. No significantly enlarged mediastinal lymph nodes. Lungs/Pleura: Small bilateral pleural effusions. Partial consolidations in the lower lobes. Negative for pneumothorax Upper Abdomen: No acute abnormality. Musculoskeletal: Healing right third, fourth rib fractures with possible acute fifth through eighth rib fractures antero laterally. Possible acute left third 7, and eighth anterolateral rib fractures. Sternotomy changes. Small amount of gas within the subcutaneous soft tissues anterior to the xiphoid process, which appears contiguous with sternal wound. No significant fluid collection to suggest soft tissue abscess. Review of the MIP images confirms the above findings. IMPRESSION: 1. Negative for acute pulmonary embolus. 2. Cardiomegaly with post CABG changes.  Trace pericardial fluid. 3. Small pleural effusions. Partial consolidations in the lower lobes, may reflect atelectasis, or mild pneumonia. 4. Small gas collection within the superficial soft tissues  anterior to the xiphoid process, may be contiguous with skin surface/sternal wound. No significant fluid collection in this region to suggest soft tissue abscess. Aortic Atherosclerosis (ICD10-I70.0). Electronically Signed   By: Donavan Foil M.D.   On: 06/08/2017 23:17   Dg Chest Port 1 View  Result Date: 05/29/2017 CLINICAL DATA:  Respiratory failure.  Ventilator support. EXAM: PORTABLE CHEST 1 VIEW COMPARISON:  05/28/2017 FINDINGS: Endotracheal tube tip is 2 cm above the carina. Nasogastric tube enters the abdomen. Right internal jugular central line tip is in the right atrium. Patchy volume loss at the right base persists. There is worsening of volume loss in the left lower lobe. IMPRESSION: Worsened volume loss in the left lower lobe. Central line tip again noted to be in the right atrium. Electronically Signed   By: Nelson Chimes M.D.   On: 05/29/2017 06:33   Dg Chest Port 1 View  Result Date: 05/28/2017 CLINICAL DATA:  Central catheter placement EXAM: PORTABLE CHEST 1 VIEW COMPARISON:  Chest radiograph and chest CT May 27, 2017 FINDINGS: Central catheter tip is in the right atrium approximately 1 cm distal to the cavoatrial junction. Endotracheal tube tip is 2.3 cm above the carina. Nasogastric tube tip and side port below the diaphragm. No pneumothorax. There is atelectatic change in lung bases. Lungs elsewhere clear. Heart is mildly enlarged with pulmonary vascularity within normal limits. Patient is status post coronary artery bypass grafting. No adenopathy. No bone lesions. There is aortic atherosclerosis. IMPRESSION: Than catheter positions as described without pneumothorax. Bibasilar atelectasis. Stable cardiac silhouette. There is aortic atherosclerosis. Aortic Atherosclerosis (ICD10-I70.0). Electronically Signed   By: Lowella Grip III M.D.   On: 05/28/2017 02:59   Dg Chest Portable 1 View  Result Date: 05/25/2017 CLINICAL DATA:  Post intubation. EXAM: PORTABLE CHEST  1 VIEW  COMPARISON:  None. FINDINGS: Endotracheal tube tip at the thoracic inlet 2.1 cm from the carina. Low lung volumes. Patient is post median sternotomy. Mild cardiomegaly. Ill-defined perihilar opacities likely pulmonary edema, low lung volumes limit assessment. Streaky opacity in the left mid lung may be atelectasis or scarring. No large pleural effusion or focal airspace disease. No pneumothorax. IMPRESSION: 1. Endotracheal tube tip 2.1 cm from the carina. 2. Low lung volumes. Cardiomegaly post CABG. Probable perihilar edema. Electronically Signed   By: Jeb Levering M.D.   On: 05/26/2017 22:42    EKG:   Orders placed or performed during the hospital encounter of 05/14/2017  . EKG 12-Lead  . EKG 12-Lead  . EKG 12-Lead  . EKG 12-Lead  . EKG 12-Lead  . EKG 12-Lead    ASSESSMENT AND PLAN:   61 year old female with past medical history significant for idiopathic cardiomyopathy with EF of 45%, hypertension, DM and CKD who had CABG at [facility] 4 weeks ago was brought in from rehab due to cardiac arrest.  1. Cardiac arrest- unsure if respiratory or cardiac cause at this time - CT chest negative for PE - appreciate cardiology and intensivist consult - ECHO, less likely to be cardiac per cardiology - hypothermia protocol and assess neuro status  2. Sepsis- on low dose  levophed, elevated procalcitonin - stool for c.diff positive, no pneumonia noted - on oral vancomycin  3. ARF- likely from ATN- with cardiac arrest, sepsis - creatinine at discharge was 1.5 last week, now worsening and also oliguric - If neuro status recovers- consider dialysis and nephrology consult  4. Anemia of chronic disease and also from recent surgery- hb at discharge was around 7.5, now at 8 - no indication for transfusion- monitor  5. Hypokalemia- replace appropriately  6. HTN- on levophed- wean and monitor  7. DVT Prophylaxis- heparin SQ   Critically ill, discussed plan with Dr.  Alva Garnet      All the records are reviewed and case discussed with Care Management/Social Workerr. Management plans discussed with the patient, family and they are in agreement.  CODE STATUS: Full Code  TOTAL TIME TAKING CARE OF THIS PATIENT: 39 minutes.   POSSIBLE D/C IN 2-3 DAYS, DEPENDING ON CLINICAL CONDITION.   Gladstone Lighter M.D on 05/29/2017 at 8:36 AM  Between 7am to 6pm - Pager - [phone]  After 6pm go to [email or web address] - password [email or web address] [place] [facility]  Office  [phone]  CC: Primary care physician; Rudi Coco, MD (Inactive)

## 2017-05-29 NOTE — Progress Notes (Signed)
Pharmacy Antibiotic Note  Kim Stevens is a 61 y.o. female admitted on 05/24/2017 admitted to ICU.  Pharmacy has been consulted for cefepime dosing. Patient is being treated for clostridium difficile with oral vancomycin and IV metronidazole. Patient underwent targeted temperature management, completed warming at 1500 on 12/20. Patient requiring ventilatory support and has elevated procalcitonin.   Plan: Cefepime 1g IV Q24hr.   Height: 5\' 4"  (162.6 cm) Weight: 147 lb 14.9 oz (67.1 kg) IBW/kg (Calculated) : 54.7  Temp (24hrs), Avg:93.3 F (34.1 C), Min:91 F (32.8 C), Max:98.6 F (37 C)  Recent Labs  Lab 05/24/2017 2220 05/28/17 0307 05/28/17 0308  05/28/17 2019 05/29/17 0118 05/29/17 0416 05/29/17 0538 05/29/17 0951 05/29/17 1357  WBC 10.2 6.3  --   --   --   --  10.4  --   --   --   CREATININE 2.24* 2.26*  --    < > 2.58* 2.83*  --  2.90* 3.16* 3.10*  LATICACIDVEN  --   --  0.7  --   --   --   --   --   --   --    < > = values in this interval not displayed.    Estimated Creatinine Clearance: 18.2 mL/min (A) (by C-G formula based on SCr of 3.1 mg/dL (H)).    Allergies  Allergen Reactions  . Meperidine Nausea And Vomiting       . Oxycodone-Acetaminophen Nausea And Vomiting         Antimicrobials this admission: Vancomycin PO 12/19 >>  Metronidazole 12/20  >>  Cefepime 12/20 >>  Dose adjustments this admission: N/A  Microbiology results: 12/19 BCx: no growth x 1 day  12/19 UCx: pending  12/19 Sputum: reincubated for better growth  12/19 CDiff: positive  12/19 GI Panel: negative  12/19  MRSA PCR: negative   Thank you for allowing pharmacy to be a part of this patient's care.  Brogan England L 05/29/2017 3:34 PM

## 2017-05-30 ENCOUNTER — Inpatient Hospital Stay: Payer: BC Managed Care – PPO

## 2017-05-30 LAB — BASIC METABOLIC PANEL
ANION GAP: 8 (ref 5–15)
ANION GAP: 6 (ref 5–15)
Anion gap: 9 (ref 5–15)
BUN: 35 mg/dL — ABNORMAL HIGH (ref 6–20)
BUN: 36 mg/dL — ABNORMAL HIGH (ref 6–20)
BUN: 39 mg/dL — ABNORMAL HIGH (ref 6–20)
CALCIUM: 7.2 mg/dL — AB (ref 8.9–10.3)
CALCIUM: 7.3 mg/dL — AB (ref 8.9–10.3)
CALCIUM: 7.4 mg/dL — AB (ref 8.9–10.3)
CHLORIDE: 108 mmol/L (ref 101–111)
CHLORIDE: 109 mmol/L (ref 101–111)
CO2: 16 mmol/L — AB (ref 22–32)
CO2: 17 mmol/L — AB (ref 22–32)
CO2: 21 mmol/L — AB (ref 22–32)
CREATININE: 3.36 mg/dL — AB (ref 0.44–1.00)
Chloride: 107 mmol/L (ref 101–111)
Creatinine, Ser: 3.53 mg/dL — ABNORMAL HIGH (ref 0.44–1.00)
Creatinine, Ser: 3.71 mg/dL — ABNORMAL HIGH (ref 0.44–1.00)
GFR calc non Af Amer: 13 mL/min — ABNORMAL LOW (ref >60)
GFR calc non Af Amer: 12 mL/min — ABNORMAL LOW (ref >60)
GFR, EST AFRICAN AMERICAN: 16 mL/min — AB (ref >60)
GFR, EST AFRICAN AMERICAN: 15 mL/min — AB (ref >60)
GFR, EST AFRICAN AMERICAN: 14 mL/min — AB (ref >60)
GFR, EST NON AFRICAN AMERICAN: 14 mL/min — AB (ref >60)
Glucose, Bld: 140 mg/dL — ABNORMAL HIGH (ref 65–99)
Glucose, Bld: 156 mg/dL — ABNORMAL HIGH (ref 65–99)
Glucose, Bld: 157 mg/dL — ABNORMAL HIGH (ref 65–99)
POTASSIUM: 4.8 mmol/L (ref 3.5–5.1)
Potassium: 4.4 mmol/L (ref 3.5–5.1)
Potassium: 4.2 mmol/L (ref 3.5–5.1)
Sodium: 132 mmol/L — ABNORMAL LOW (ref 135–145)
Sodium: 133 mmol/L — ABNORMAL LOW (ref 135–145)
Sodium: 136 mmol/L (ref 135–145)

## 2017-05-30 LAB — HEMOGLOBIN AND HEMATOCRIT, BLOOD
HCT: 21.5 % — ABNORMAL LOW (ref 35.0–47.0)
HEMATOCRIT: 20 % — AB (ref 35.0–47.0)
HEMOGLOBIN: 6.6 g/dL — AB (ref 12.0–16.0)
Hemoglobin: 7 g/dL — ABNORMAL LOW (ref 12.0–16.0)

## 2017-05-30 LAB — COMPREHENSIVE METABOLIC PANEL
ALBUMIN: 1.6 g/dL — AB (ref 3.5–5.0)
ALT: 12 U/L — ABNORMAL LOW (ref 14–54)
ANION GAP: 8 (ref 5–15)
AST: 36 U/L (ref 15–41)
Alkaline Phosphatase: 73 U/L (ref 38–126)
BILIRUBIN TOTAL: 0.4 mg/dL (ref 0.3–1.2)
BUN: 34 mg/dL — AB (ref 6–20)
CO2: 15 mmol/L — AB (ref 22–32)
Calcium: 6.5 mg/dL — ABNORMAL LOW (ref 8.9–10.3)
Chloride: 111 mmol/L (ref 101–111)
Creatinine, Ser: 3.25 mg/dL — ABNORMAL HIGH (ref 0.44–1.00)
GFR calc Af Amer: 17 mL/min — ABNORMAL LOW (ref >60)
GFR calc non Af Amer: 14 mL/min — ABNORMAL LOW (ref >60)
GLUCOSE: 143 mg/dL — AB (ref 65–99)
POTASSIUM: 4.1 mmol/L (ref 3.5–5.1)
SODIUM: 134 mmol/L — AB (ref 135–145)
TOTAL PROTEIN: 4.5 g/dL — AB (ref 6.5–8.1)

## 2017-05-30 LAB — CBC
HEMATOCRIT: 23.5 % — AB (ref 35.0–47.0)
HEMOGLOBIN: 7.4 g/dL — AB (ref 12.0–16.0)
MCH: 28.8 pg (ref 26.0–34.0)
MCHC: 31.6 g/dL — ABNORMAL LOW (ref 32.0–36.0)
MCV: 90.9 fL (ref 80.0–100.0)
Platelets: 266 10*3/uL (ref 150–440)
RBC: 2.58 MIL/uL — ABNORMAL LOW (ref 3.80–5.20)
RDW: 16.9 % — ABNORMAL HIGH (ref 11.5–14.5)
WBC: 12.4 10*3/uL — AB (ref 3.6–11.0)

## 2017-05-30 LAB — GLUCOSE, CAPILLARY
GLUCOSE-CAPILLARY: 137 mg/dL — AB (ref 65–99)
GLUCOSE-CAPILLARY: 150 mg/dL — AB (ref 65–99)
GLUCOSE-CAPILLARY: 153 mg/dL — AB (ref 65–99)
GLUCOSE-CAPILLARY: 162 mg/dL — AB (ref 65–99)
GLUCOSE-CAPILLARY: 183 mg/dL — AB (ref 65–99)
Glucose-Capillary: 137 mg/dL — ABNORMAL HIGH (ref 65–99)
Glucose-Capillary: 152 mg/dL — ABNORMAL HIGH (ref 65–99)

## 2017-05-30 LAB — URINE CULTURE

## 2017-05-30 LAB — CULTURE, RESPIRATORY W GRAM STAIN

## 2017-05-30 LAB — CULTURE, RESPIRATORY: CULTURE: NORMAL

## 2017-05-30 LAB — PROCALCITONIN: PROCALCITONIN: 46.04 ng/mL

## 2017-05-30 MED ORDER — SODIUM BICARBONATE 8.4 % IV SOLN
INTRAVENOUS | Status: DC
  Administered 2017-05-30 – 2017-06-02 (×6): via INTRAVENOUS
  Filled 2017-05-30 (×9): qty 150

## 2017-05-30 MED ORDER — PRO-STAT SUGAR FREE PO LIQD
30.0000 mL | Freq: Four times a day (QID) | ORAL | Status: DC
  Administered 2017-05-30 – 2017-06-02 (×12): 30 mL

## 2017-05-30 MED ORDER — ADULT MULTIVITAMIN LIQUID CH
15.0000 mL | Freq: Every day | ORAL | Status: DC
  Administered 2017-05-30 – 2017-06-02 (×3): 15 mL
  Filled 2017-05-30 (×5): qty 15

## 2017-05-30 MED ORDER — VITAL HIGH PROTEIN PO LIQD
1000.0000 mL | ORAL | Status: DC
  Administered 2017-05-30: 1000 mL

## 2017-05-30 NOTE — Progress Notes (Signed)
[facility] [place] NAME: Kim Stevens    MR#:  [id number]  DATE OF BIRTH:  Aug 26, 1955  SUBJECTIVE:  CHIEF COMPLAINT:   Chief Complaint  Patient presents with  . Cardiac Arrest   - cardiac arrest, recent CABG 4 weeks ago - on low dose pressors, off sedation today. Off hypothermia protocol. Body is rewarmed. -Fentanyl sedation discontinued this morning. Awaiting for mental status recovery -Renal function worse  REVIEW OF SYSTEMS:  Review of Systems  Unable to perform ROS: Critical illness  Intubated and sedated  DRUG ALLERGIES:   Allergies  Allergen Reactions  . Meperidine Nausea And Vomiting       . Oxycodone-Acetaminophen Nausea And Vomiting         VITALS:  Blood pressure (!) 148/50, pulse 74, temperature 98.6 F (37 C), temperature source Bladder, resp. rate 20, height 5\' 4"  (1.626 m), weight 68 kg (149 lb 14.6 oz), SpO2 100 %.  PHYSICAL EXAMINATION:  Physical Exam  GENERAL:  61 y.o.-year-old patient lying in the bed, critically ill appearing.  EYES: Pupils pinpoint with sliggish reactive to light. No scleral icterus.  HEENT: Head atraumatic, normocephalic. Oropharynx and nasopharynx clear.  NECK:  Supple, no jugular venous distention. No thyroid enlargement, no tenderness.  LUNGS: Normal breath sounds bilaterally, no wheezing, rales,rhonchi or crepitation. No use of accessory muscles of respiration. Decreased at the bases CARDIOVASCULAR: S1, S2 normal. No murmurs, rubs, or gallops.  CABG incision with gaping noted- dressing in place- no purulence noted. ABDOMEN: Soft, nontender, nondistended. Hypoactive Bowel sounds present. No organomegaly or mass. Cooling gel pads on the abdomen EXTREMITIES: No pedal edema, cyanosis, or clubbing.  NEUROLOGIC: not responding currently PSYCHIATRIC: The patient is obtunded.  SKIN: No obvious rash, lesion, or ulcer.    LABORATORY PANEL:   CBC Recent Labs  Lab 05/30/17 0406  WBC  12.4*  HGB 7.4*  HCT 23.5*  PLT 266   ------------------------------------------------------------------------------------------------------------------  Chemistries  Recent Labs  Lab 05/11/2017 2220  05/30/17 0551  NA 131*   < > 133*  K 4.8   < > 4.8  CL 96*   < > 108  CO2 19*   < > 17*  GLUCOSE 220*   < > 156*  BUN 34*   < > 36*  CREATININE 2.24*   < > 3.53*  CALCIUM 8.3*   < > 7.3*  AST 54*  --   --   ALT 26  --   --   ALKPHOS 95  --   --   BILITOT 0.4  --   --    < > = values in this interval not displayed.   ------------------------------------------------------------------------------------------------------------------  Cardiac Enzymes Recent Labs  Lab 05/28/17 1756  TROPONINI 0.04*   ------------------------------------------------------------------------------------------------------------------  RADIOLOGY:  Ct Head Wo Contrast  Result Date: 05/29/2017 CLINICAL DATA:  Initial evaluation for acute altered mental status, unexplained. Status post cardiac arrest. EXAM: CT HEAD WITHOUT CONTRAST TECHNIQUE: Contiguous axial images were obtained from the base of the skull through the vertex without intravenous contrast. COMPARISON:  Prior CT from 05/28/2017. FINDINGS: Brain: Cerebral volume within normal limits. No acute intracranial hemorrhage. No evidence for acute large vessel territory infarct. Gray-white matter differentiation maintained without evidence for cerebral anoxia or cerebral edema. No mass lesion or midline shift. Ventricles stable in size without hydrocephalus. No extra-axial fluid collection. Vascular: No hyperdense vessel. Skull: Scalp soft tissues and calvarium within normal limits. Sinuses/Orbits: Globes and orbital soft tissues normal.  Patient status post lens extraction bilaterally. Minimal layering secretions noted within the left sphenoid sinus. Paranasal sinuses are otherwise clear. A sequelae of prior mastoidectomy noted on the left. Right mastoid air  cells clear. Other: None. IMPRESSION: Stable normal head CT. No acute intracranial abnormality identified. Electronically Signed   By: Jeannine Boga M.D.   On: 05/29/2017 18:50   Dg Chest Port 1 View  Result Date: 05/30/2017 CLINICAL DATA:  Endotracheal tube present. EXAM: PORTABLE CHEST 1 VIEW COMPARISON:  Chest x-rays dated 05/29/2017 and 05/28/2017. FINDINGS: Endotracheal tube is well positioned with tip just above the level of the carina. Right IJ central line is well position with tip at the level of the lower SVC/ cavoatrial junction. Enteric tube passes below the diaphragm. Heart size and mediastinal contours are stable. Probable mild atelectasis at each lung base. Probable small left pleural effusion. Lungs otherwise clear. No pneumothorax seen. IMPRESSION: 1. Probable mild atelectasis at each lung base. Probable small left pleural effusion. No new lung findings. 2. Support apparatus appears appropriately positioned. Electronically Signed   By: Franki Cabot M.D.   On: 05/30/2017 07:09   Dg Chest Port 1 View  Result Date: 05/29/2017 CLINICAL DATA:  Respiratory failure.  Ventilator support. EXAM: PORTABLE CHEST 1 VIEW COMPARISON:  05/28/2017 FINDINGS: Endotracheal tube tip is 2 cm above the carina. Nasogastric tube enters the abdomen. Right internal jugular central line tip is in the right atrium. Patchy volume loss at the right base persists. There is worsening of volume loss in the left lower lobe. IMPRESSION: Worsened volume loss in the left lower lobe. Central line tip again noted to be in the right atrium. Electronically Signed   By: Nelson Chimes M.D.   On: 05/29/2017 06:33    EKG:   Orders placed or performed during the hospital encounter of 05/14/2017  . EKG 12-Lead  . EKG 12-Lead  . EKG 12-Lead  . EKG 12-Lead  . EKG 12-Lead  . 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  1. Cardiac arrest-  ? Sepsis vs respiratorycause likely - CT chest negative for PE - appreciate cardiology and intensivist consult - ECHO done, less likely to be cardiac per cardiology - hypothermia protocol finished and assess neuro status -Remains intubated  2. Sepsis- on low dose  levophed, elevated procalcitonin - stool for c.diff positive, no pneumonia noted- on empiric cefepime - on oral vancomycin and IV flagyl  3. ARF- likely from [facility]- with cardiac arrest, sepsis - creatinine at discharge was 1.5  last week, now worsening and also oliguric - nephrology consult -No acute indication for dialysis yet -Started on bicarbonate drip  4. Anemia of chronic disease and also from recent surgery- hb at discharge was around 7.5, now at 8 - no indication for transfusion- monitor  5. Acute encephalopathy-likely secondary to metabolic causes -CT head without any acute findings yesterday. It does not wake up, consider MRI and EEG. -Off sedation this morning  6. DVT Prophylaxis- heparin SQ   Critically ill, discussed plan with husband at bedside     All the records are reviewed and case discussed with Care Management/Social Workerr. Management plans discussed with the patient, family and they are in agreement.  CODE STATUS: Full Code  TOTAL TIME TAKING CARE  OF THIS PATIENT: 38 minutes.   POSSIBLE D/C IN 2-3 DAYS, DEPENDING ON CLINICAL CONDITION.   Gladstone Lighter M.D on 05/30/2017 at 9:45 AM  Between 7am to 6pm - Pager - [phone]  After 6pm go to [email or web address] - password [email or web address] [facility]  Office  [phone]  CC: Primary care physician; Rudi Coco, MD (Inactive)

## 2017-05-30 NOTE — Consult Note (Signed)
Reason for Consult:Unresponsive s/p arrest Referring Physician: Tressia Miners  CC: Unresponsive s/p arrest  HPI: Kim Stevens is an 61 y.o. female who presented with PEA arrest.  Patient had CABG procedure done at the end of last month, and subsequent to her procedure during that same hospitalization had a cardiac arrest.  She was resuscitated and recovered and was eventually discharged to rehab.  Today she was found late in the evening around 9:00 or just after by staff at the facility unresponsive. Her last known well had been 2 hours earlier.  She was pulseless and CPR was initiated.  EMS arrived and 3 rounds of CPR and epinephrine were administered and ROSC was achieved.  Patient arrived at the ED and was intubated without requirement for any sedation.  However, family states that after her last arrest she reacted similarly and remained heavily unresponsive for couple of days and then awoke and recovered.  Cardiology was contacted by ED physician and they recommended against emergent catheterization.  Arctic sun protocol was initiated and patient was admitted to the ICU.  Patient was warmed as of 1500 yesterday.  This morning her Fentanyl was discontinued.  Patient has remained unresponsive.    Past Medical History:  Diagnosis Date  . Anxiety state 01/22/2011  . Combined forms of age-related cataract of both eyes 03/30/2014  . Diabetes mellitus without complication ([place])   . HTN (hypertension) 08/30/2013  . Hypertension   . Irritable bowel syndrome 01/22/2011  . Microalbuminuria 08/15/2015  . Polyneuropathy associated with underlying disease ([place]) 08/15/2015   Overview:  Gabapentin qhs  . Type 2 diabetes mellitus not at goal [facility]) 08/15/2015   Overview:  Can no longer go to [facility]. Referred to [facility] 3/17    Past Surgical History:  Procedure Laterality Date  . CHOLECYSTECTOMY    . COLONOSCOPY WITH PROPOFOL N/A 07/15/2016   Procedure: COLONOSCOPY WITH PROPOFOL;  Surgeon: Lucilla Lame, MD;   Location: [facility];  Service: Endoscopy;  Laterality: N/A;  Diabetic - insulin  . EYE SURGERY    . POLYPECTOMY  07/15/2016   Procedure: POLYPECTOMY;  Surgeon: Lucilla Lame, MD;  Location: [place];  Service: Endoscopy;;    Family History  Problem Relation Age of Onset  . Cancer Mother   . Diabetes Father   . Stroke Maternal Grandmother     Social History:  reports that  has never smoked. she has never used smokeless tobacco. She reports that she drinks alcohol. She reports that she does not use drugs.  Allergies  Allergen Reactions  . Meperidine Nausea And Vomiting       . Oxycodone-Acetaminophen Nausea And Vomiting         Medications:  I have reviewed the patient's current medications. Prior to Admission:  Medications Prior to Admission  Medication Sig Dispense Refill Last Dose  . acetaminophen (TYLENOL) 325 MG tablet Take 650 mg by mouth every 6 (six) hours as needed for mild pain.   prn at prn  . albuterol (PROVENTIL) (2.5 MG/3ML) 0.083% nebulizer solution Take 2.5 mg by nebulization 2 (two) times daily as needed for wheezing or shortness of breath.   prn at prn  . antiseptic oral rinse (BIOTENE) LIQD 15 mLs by Mouth Rinse route every 2 (two) hours as needed for dry mouth.   prn at prn  . atorvastatin (LIPITOR) 20 MG tablet Take 20 mg by mouth daily at 6 PM.   05/26/2017 at Unknown time  . bisacodyl (DULCOLAX) 10 MG suppository Place  10 mg rectally daily as needed for moderate constipation.   prn at prn  . cholecalciferol (VITAMIN D) 1000 units tablet Take 1,000 Units by mouth daily.   05/31/2017 at Unknown time  . Cold Sore Products (BLISTEX EX) Apply 1 application topically every 2 (two) hours as needed (dry lips).   prn at prn  . gabapentin (NEURONTIN) 300 MG capsule Take 300 mg by mouth every 6 (six) hours as needed (neuropathy).   prn at prn  . Glucagon, rDNA, (GLUCAGON EMERGENCY IJ) Inject 0.25 mLs as directed as needed (low blood sugar).   prn at prn   . heparin 10000 UNIT/ML injection Inject 5,000 Units into the skin every 8 (eight) hours.   06/07/2017 at Unknown time  . insulin glargine (LANTUS) 100 UNIT/ML injection Inject 14 Units into the skin at bedtime.    05/28/2017 at Unknown time  . insulin lispro (HUMALOG) 100 UNIT/ML injection Inject 0-10 Units into the skin 4 (four) times daily. 0-150= 0 units 151-200= 2 units 201-250= 4 units 251-300= 6 units 301-350= 8 units Greater than 350= 10 units. Recheck in 2 hours. Call MD   05/19/2017 at Unknown time  . Lactobacillus Rhamnosus, GG, (CULTURELLE) CAPS Take 1 capsule by mouth daily.   06/05/2017 at Unknown time  . levothyroxine (SYNTHROID, LEVOTHROID) 25 MCG tablet Take 25 mcg by mouth daily before breakfast.   05/26/2017 at Unknown time  . LORazepam (ATIVAN) 0.5 MG tablet Take 0.5 mg by mouth every 12 (twelve) hours as needed for anxiety.   prn at prn  . Melatonin 3 MG TABS Take 3 mg by mouth at bedtime.   05/26/2017 at Unknown time  . polyethylene glycol (MIRALAX / GLYCOLAX) packet Take 17 g by mouth daily as needed for mild constipation.   prn at prn  . sildenafil (REVATIO) 20 MG tablet Take 20 mg by mouth 3 (three) times daily.   06/08/2017 at 0800  . sitaGLIPtin (JANUVIA) 50 MG tablet Take 50 mg by mouth daily.   05/13/2017 at Unknown time  . spironolactone (ALDACTONE) 25 MG tablet Take 25 mg by mouth daily.   05/22/2017 at Unknown time  . torsemide (DEMADEX) 20 MG tablet Take 40 mg by mouth daily.   05/11/2017 at Unknown time  . Vitamin D, Ergocalciferol, (DRISDOL) 50000 units CAPS capsule Take 50,000 Units by mouth every 7 (seven) days.   05/26/2017 at Unknown time  . amLODipine (NORVASC) 10 MG tablet Take by mouth.   Not Taking at Unknown time  . atorvastatin (LIPITOR) 40 MG tablet Take by mouth.   Not Taking at Unknown time  . lisinopril-hydrochlorothiazide (PRINZIDE,ZESTORETIC) 20-25 MG tablet Take by mouth.   Not Taking at Unknown time   Scheduled: . aspirin  81 mg Per Tube  Daily  . chlorhexidine gluconate (MEDLINE KIT)  15 mL Mouth Rinse BID  . famotidine  20 mg Per Tube QHS  . feeding supplement (PRO-STAT SUGAR FREE 64)  30 mL Per Tube QID  . fentaNYL (SUBLIMAZE) injection  100 mcg Intravenous Once  . heparin  5,000 Units Subcutaneous Q8H  . mouth rinse  15 mL Mouth Rinse 10 times per day  . multivitamin  15 mL Per Tube Daily  . vancomycin  500 mg Per Tube Q6H    ROS: Unable to provide due to mental status  Physical Examination: Blood pressure (!) 148/50, pulse 74, temperature 98.6 F (37 C), temperature source Bladder, resp. rate 20, height 5' 4"  (1.626 m), weight 68 kg (149 lb  14.6 oz), SpO2 100 %.  HEENT-  Normocephalic, no lesions, without obvious abnormality.  Normal external eye and conjunctiva.  Normal TM's bilaterally.  Normal auditory canals and external ears. Normal external nose, mucus membranes and septum.  Normal pharynx. Cardiovascular- S1, S2 normal, pulses palpable throughout   Lungs- chest clear, no wheezing, rales, normal symmetric air entry Abdomen- soft, non-tender; bowel sounds normal; no masses,  no organomegaly Extremities- mild LE edema Lymph-no adenopathy palpable Musculoskeletal-no joint tenderness, deformity or swelling Skin-warm and dry, no hyperpigmentation, vitiligo, or suspicious lesions  Neurological Examination   Mental Status: Patient does not respond to verbal stimuli.  Does not respond to deep sternal rub.  Does not follow commands.  No verbalizations noted.  Cranial Nerves: II: patient does not respond confrontation bilaterally, pupils right 2 mm, left 2 mm,and unreactive bilaterally III,IV,VI: doll's response absent bilaterally.  V,VII: corneal reflex absent bilaterally  VIII: patient does not respond to verbal stimuli IX,X: gag reflex reduced, XI: trapezius strength unable to test bilaterally XII: tongue strength unable to test Motor: Extremities flaccid throughout.  No spontaneous movement noted.  No  purposeful movements noted. Sensory: Does not respond to noxious stimuli in any extremity. Deep Tendon Reflexes:  1+ in the upper extremities and absent in the lower extremities Plantars: Mute bilaterally Cerebellar: Unable to perform   Laboratory Studies:   Basic Metabolic Panel: Recent Labs  Lab 05/29/17 1806 05/29/17 2155 05/30/17 0213 05/30/17 0551 05/30/17 1316  NA 131* 132* 132* 133* 134*  K 3.8 4.1 4.4 4.8 4.1  CL 104 105 107 108 111  CO2 16* 17* 16* 17* 15*  GLUCOSE 120* 124* 140* 156* 143*  BUN 36* 35* 35* 36* 34*  CREATININE 3.19* 3.34* 3.36* 3.53* 3.25*  CALCIUM 7.1* 7.2* 7.2* 7.3* 6.5*    Liver Function Tests: Recent Labs  Lab 05/14/2017 2220 05/30/17 1316  AST 54* 36  ALT 26 12*  ALKPHOS 95 73  BILITOT 0.4 0.4  PROT 6.5 4.5*  ALBUMIN 2.6* 1.6*   No results for input(s): LIPASE, AMYLASE in the last 168 hours. No results for input(s): AMMONIA in the last 168 hours.  CBC: Recent Labs  Lab 06/06/2017 2220 05/28/17 0307 05/29/17 0416 05/30/17 0406 05/30/17 1316  WBC 10.2 6.3 10.4 12.4*  --   NEUTROABS 7.3*  --   --   --   --   HGB 8.1* 7.8* 8.2* 7.4* 7.0*  HCT 25.5* 23.7* 25.0* 23.5* 21.5*  MCV 93.2 90.2 89.4 90.9  --   PLT 232 204 245 266  --     Cardiac Enzymes: Recent Labs  Lab 05/20/2017 2220 05/28/17 0307 05/28/17 0643 05/28/17 1256 05/28/17 1756  TROPONINI 0.03* 0.06* 0.05* 0.04* 0.04*    BNP: Invalid input(s): POCBNP  CBG: Recent Labs  Lab 05/29/17 2353 05/30/17 0216 05/30/17 0411 05/30/17 0555 05/30/17 1231  GLUCAP 129* 137* 137* 152* 150*    Microbiology: Results for orders placed or performed during the hospital encounter of 05/26/2017  C difficile quick scan w PCR reflex     Status: Abnormal   Collection Time: 05/28/17 12:03 AM  Result Value Ref Range Status   C Diff antigen POSITIVE (A) NEGATIVE Final   C Diff toxin POSITIVE (A) NEGATIVE Final   C Diff interpretation Toxin producing C. difficile detected.  Final     Comment: CRITICAL RESULT CALLED TO, READ BACK BY AND VERIFIED WITH: Tia Masker AT 0142 05/28/17 ALV   Gastrointestinal Panel by PCR , Stool  Status: None   Collection Time: 05/28/17 12:03 AM  Result Value Ref Range Status   Campylobacter species NOT DETECTED NOT DETECTED Final   Plesimonas shigelloides NOT DETECTED NOT DETECTED Final   Salmonella species NOT DETECTED NOT DETECTED Final   Yersinia enterocolitica NOT DETECTED NOT DETECTED Final   Vibrio species NOT DETECTED NOT DETECTED Final   Vibrio cholerae NOT DETECTED NOT DETECTED Final   Enteroaggregative E coli (EAEC) NOT DETECTED NOT DETECTED Final   Enteropathogenic E coli (EPEC) NOT DETECTED NOT DETECTED Final   Enterotoxigenic E coli (ETEC) NOT DETECTED NOT DETECTED Final   Shiga like toxin producing E coli (STEC) NOT DETECTED NOT DETECTED Final   Shigella/Enteroinvasive E coli (EIEC) NOT DETECTED NOT DETECTED Final   Cryptosporidium NOT DETECTED NOT DETECTED Final   Cyclospora cayetanensis NOT DETECTED NOT DETECTED Final   Entamoeba histolytica NOT DETECTED NOT DETECTED Final   Giardia lamblia NOT DETECTED NOT DETECTED Final   Adenovirus F40/41 NOT DETECTED NOT DETECTED Final   Astrovirus NOT DETECTED NOT DETECTED Final   Norovirus GI/GII NOT DETECTED NOT DETECTED Final   Rotavirus A NOT DETECTED NOT DETECTED Final   Sapovirus (I, II, IV, and V) NOT DETECTED NOT DETECTED Final  MRSA PCR Screening     Status: None   Collection Time: 05/28/17  3:28 AM  Result Value Ref Range Status   MRSA by PCR NEGATIVE NEGATIVE Final    Comment:        The GeneXpert MRSA Assay (FDA approved for NASAL specimens only), is one component of a comprehensive MRSA colonization surveillance program. It is not intended to diagnose MRSA infection nor to guide or monitor treatment for MRSA infections.   Culture, blood (Routine X 2) w Reflex to ID Panel     Status: None (Preliminary result)   Collection Time: 05/28/17  3:29 AM   Result Value Ref Range Status   Specimen Description BLOOD RIGHT HAND  Final   Special Requests   Final    BOTTLES DRAWN AEROBIC AND ANAEROBIC Blood Culture adequate volume   Culture NO GROWTH 2 DAYS  Final   Report Status PENDING  Incomplete  Culture, blood (Routine X 2) w Reflex to ID Panel     Status: None (Preliminary result)   Collection Time: 05/28/17  3:35 AM  Result Value Ref Range Status   Specimen Description BLOOD LEFT HAND  Final   Special Requests   Final    BOTTLES DRAWN AEROBIC AND ANAEROBIC Blood Culture adequate volume   Culture NO GROWTH 2 DAYS  Final   Report Status PENDING  Incomplete  Culture, respiratory (NON-Expectorated)     Status: None (Preliminary result)   Collection Time: 05/28/17  6:56 AM  Result Value Ref Range Status   Specimen Description TRACHEAL ASPIRATE  Final   Special Requests NONE  Final   Gram Stain   Final    FEW WBC PRESENT, PREDOMINANTLY PMN MODERATE GRAM POSITIVE COCCI IN CHAINS IN CLUSTERS FEW GRAM NEGATIVE RODS FEW GRAM NEGATIVE COCCOBACILLI FEW GRAM POSITIVE RODS    Culture   Final    CULTURE REINCUBATED FOR BETTER GROWTH Performed at [facility], [place] [address] [place], [place] [address]    Report Status PENDING  Incomplete    Coagulation Studies: Recent Labs    05/28/17 0307 05/28/17 0854  LABPROT 14.1 14.6  INR 1.10 1.15    Urinalysis:  Recent Labs  Lab 05/29/17 [place]*  LABSPEC 1.018  PHURINE 5.0  GLUCOSEU 50*  HGBUR LARGE*  BILIRUBINUR NEGATIVE  KETONESUR NEGATIVE  PROTEINUR 100*  NITRITE POSITIVE*  LEUKOCYTESUR LARGE*    Lipid Panel:  No results found for: CHOL, TRIG, HDL, CHOLHDL, VLDL, LDLCALC  HgbA1C: No results found for: HGBA1C  Urine Drug Screen:  No results found for: LABOPIA, COCAINSCRNUR, LABBENZ, AMPHETMU, THCU, LABBARB  Alcohol Level: No results for input(s): ETH in the last 168 hours.  Other results: EKG: normal sinus rhythm at 66 bpm.  Imaging: Ct Head Wo  Contrast  Result Date: 05/29/2017 CLINICAL DATA:  Initial evaluation for acute altered mental status, unexplained. Status post cardiac arrest. EXAM: CT HEAD WITHOUT CONTRAST TECHNIQUE: Contiguous axial images were obtained from the base of the skull through the vertex without intravenous contrast. COMPARISON:  Prior CT from 05/28/2017. FINDINGS: Brain: Cerebral volume within normal limits. No acute intracranial hemorrhage. No evidence for acute large vessel territory infarct. Gray-white matter differentiation maintained without evidence for cerebral anoxia or cerebral edema. No mass lesion or midline shift. Ventricles stable in size without hydrocephalus. No extra-axial fluid collection. Vascular: No hyperdense vessel. Skull: Scalp soft tissues and calvarium within normal limits. Sinuses/Orbits: Globes and orbital soft tissues normal. Patient status post lens extraction bilaterally. Minimal layering secretions noted within the left sphenoid sinus. Paranasal sinuses are otherwise clear. A sequelae of prior mastoidectomy noted on the left. Right mastoid air cells clear. Other: None. IMPRESSION: Stable normal head CT. No acute intracranial abnormality identified. Electronically Signed   By: Jeannine Boga M.D.   On: 05/29/2017 18:50   US Renal  Result Date: 05/30/2017 CLINICAL DATA:  Acute kidney failure superimposed on chronic kidney disease. EXAM: RENAL / URINARY TRACT ULTRASOUND COMPLETE COMPARISON:  CT, 12/10/2011 FINDINGS: Right Kidney: Length: 10.1 cm. Mild increase in parenchymal echogenicity. No mass or stone. No hydronephrosis. Left Kidney: Length: 10.1 cm. Echogenicity within normal limits. No mass or hydronephrosis visualized. Bladder: Not well distended.  Grossly unremarkable. IMPRESSION: 1. No acute findings.  No hydronephrosis.  No renal masses. 2. Mild increased right renal parenchymal echogenicity consistent with medical renal disease. No other abnormality. Electronically Signed   By:  Lajean Manes M.D.   On: 05/30/2017 11:06   Dg Chest Port 1 View  Result Date: 05/30/2017 CLINICAL DATA:  Endotracheal tube present. EXAM: PORTABLE CHEST 1 VIEW COMPARISON:  Chest x-rays dated 05/29/2017 and 05/28/2017. FINDINGS: Endotracheal tube is well positioned with tip just above the level of the carina. Right IJ central line is well position with tip at the level of the lower SVC/ cavoatrial junction. Enteric tube passes below the diaphragm. Heart size and mediastinal contours are stable. Probable mild atelectasis at each lung base. Probable small left pleural effusion. Lungs otherwise clear. No pneumothorax seen. IMPRESSION: 1. Probable mild atelectasis at each lung base. Probable small left pleural effusion. No new lung findings. 2. Support apparatus appears appropriately positioned. Electronically Signed   By: Franki Cabot M.D.   On: 05/30/2017 07:09   Dg Chest Port 1 View  Result Date: 05/29/2017 CLINICAL DATA:  Respiratory failure.  Ventilator support. EXAM: PORTABLE CHEST 1 VIEW COMPARISON:  05/28/2017 FINDINGS: Endotracheal tube tip is 2 cm above the carina. Nasogastric tube enters the abdomen. Right internal jugular central line tip is in the right atrium. Patchy volume loss at the right base persists. There is worsening of volume loss in the left lower lobe. IMPRESSION: Worsened volume loss in the left lower lobe. Central line tip again noted to be in the right atrium. Electronically Signed  By: Nelson Chimes M.D.   On: 05/29/2017 06:33     Assessment/Plan: 61 year old female presenting after PEA arrest.  Status post arctic sun protocol.  Less than 24 hours warmed and less than 24 hours off sedation.  Remains unresponsive.  Head CT reviewed and shows no acute changes.  Although neurological examination is not encouraging at this time would give 72 hours after warming before would make any determination as to prognosis.    Recommendations: 1.  If no improvement in neurological  examination as we near 72 hours s/p warming would perform MRI of the brain without contrast.  Would consider attempts at prognostication at that time.    Discussed with family and addressed their questions/concerns.    Alexis Goodell, MD Neurology [phone] 05/30/2017, 2:30 PM

## 2017-05-30 NOTE — Consult Note (Signed)
[facility]  CONSULT NOTE    Date: 05/30/2017                  Patient Name:  Kim Stevens  MRN: [id number]  DOB: Jul 13, 1955  Age / Sex: 61 y.o., female         PCP: Rudi Coco, MD (Inactive)                 Service Requesting Consult: Dr. Tressia Miners                 Reason for Consult: Acute Renal Failure            History of Present Illness: Ms. Kim Stevens is a 61 y.o. white female with coronary artery disease status post CABG on 04/2017, hypertension, hyperlipidemia, diabetes mellitus type II insulin dependent, diabetic neuropathy, irritable bowel syndrome,  who was admitted to [facility] on 05/11/2017 for Cardiac arrest ([place]) [I46.9] PEA (Pulseless electrical activity) ([place]) [I46.9]  Patient underwent hyperthermic protocol. Did not require another cardiac catheterization. Was intubated for respiratory distress. Placed on norepinephrine. Patient creatinine 2.24 on admission. Now 3.53 with oliguric urine output.   Husband at bedside who gives history.   Patient with C. Diff colitis with diarrhea   Medications: Outpatient medications: Medications Prior to Admission  Medication Sig Dispense Refill Last Dose  . acetaminophen (TYLENOL) 325 MG tablet Take 650 mg by mouth every 6 (six) hours as needed for mild pain.   prn at prn  . albuterol (PROVENTIL) (2.5 MG/3ML) 0.083% nebulizer solution Take 2.5 mg by nebulization 2 (two) times daily as needed for wheezing or shortness of breath.   prn at prn  . antiseptic oral rinse (BIOTENE) LIQD 15 mLs by Mouth Rinse route every 2 (two) hours as needed for dry mouth.   prn at prn  . atorvastatin (LIPITOR) 20 MG tablet Take 20 mg by mouth daily at 6 PM.   05/26/2017 at Unknown time  . bisacodyl (DULCOLAX) 10 MG suppository Place 10 mg rectally daily as needed for moderate constipation.   prn at prn  . cholecalciferol (VITAMIN D) 1000 units tablet Take 1,000 Units by mouth daily.   05/24/2017 at Unknown time  . Cold  Sore Products (BLISTEX EX) Apply 1 application topically every 2 (two) hours as needed (dry lips).   prn at prn  . gabapentin (NEURONTIN) 300 MG capsule Take 300 mg by mouth every 6 (six) hours as needed (neuropathy).   prn at prn  . Glucagon, rDNA, (GLUCAGON EMERGENCY IJ) Inject 0.25 mLs as directed as needed (low blood sugar).   prn at prn  . heparin 10000 UNIT/ML injection Inject 5,000 Units into the skin every 8 (eight) hours.   05/21/2017 at Unknown time  . insulin glargine (LANTUS) 100 UNIT/ML injection Inject 14 Units into the skin at bedtime.    06/07/2017 at Unknown time  . insulin lispro (HUMALOG) 100 UNIT/ML injection Inject 0-10 Units into the skin 4 (four) times daily. 0-150= 0 units 151-200= 2 units 201-250= 4 units 251-300= 6 units 301-350= 8 units Greater than 350= 10 units. Recheck in 2 hours. Call MD   06/08/2017 at Unknown time  . Lactobacillus Rhamnosus, GG, (CULTURELLE) CAPS Take 1 capsule by mouth daily.   05/14/2017 at Unknown time  . levothyroxine (SYNTHROID, LEVOTHROID) 25 MCG tablet Take 25 mcg by mouth daily before breakfast.   05/25/2017 at Unknown time  . LORazepam (ATIVAN) 0.5 MG tablet Take 0.5 mg by  mouth every 12 (twelve) hours as needed for anxiety.   prn at prn  . Melatonin 3 MG TABS Take 3 mg by mouth at bedtime.   05/26/2017 at Unknown time  . polyethylene glycol (MIRALAX / GLYCOLAX) packet Take 17 g by mouth daily as needed for mild constipation.   prn at prn  . sildenafil (REVATIO) 20 MG tablet Take 20 mg by mouth 3 (three) times daily.   06/01/2017 at 0800  . sitaGLIPtin (JANUVIA) 50 MG tablet Take 50 mg by mouth daily.   06/03/2017 at Unknown time  . spironolactone (ALDACTONE) 25 MG tablet Take 25 mg by mouth daily.   05/23/2017 at Unknown time  . torsemide (DEMADEX) 20 MG tablet Take 40 mg by mouth daily.   05/22/2017 at Unknown time  . Vitamin D, Ergocalciferol, (DRISDOL) 50000 units CAPS capsule Take 50,000 Units by mouth every 7 (seven) days.    05/26/2017 at Unknown time  . amLODipine (NORVASC) 10 MG tablet Take by mouth.   Not Taking at Unknown time  . atorvastatin (LIPITOR) 40 MG tablet Take by mouth.   Not Taking at Unknown time  . lisinopril-hydrochlorothiazide (PRINZIDE,ZESTORETIC) 20-25 MG tablet Take by mouth.   Not Taking at Unknown time    Current medications: Current Facility-Administered Medications  Medication Dose Route Frequency Provider Last Rate Last Dose  . 0.9 %  sodium chloride infusion  250 mL Intravenous PRN Lance Coon, MD      . artificial tears (LACRILUBE) ophthalmic ointment 1 application  1 application Both Eyes H6D Varughese, Bincy S, NP   1 application at 14/97/02 1405  . aspirin chewable tablet 81 mg  81 mg Per Tube Daily Wilhelmina Mcardle, MD   81 mg at 05/29/17 6378  . ceFEPIme (MAXIPIME) 1 g in dextrose 5 % 50 mL IVPB  1 g Intravenous Q24H Gladstone Lighter, MD   Stopped at 05/29/17 1235  . chlorhexidine gluconate (MEDLINE KIT) (PERIDEX) 0.12 % solution 15 mL  15 mL Mouth Rinse BID Wilhelmina Mcardle, MD   15 mL at 05/29/17 2000  . dextrose 5% lactated ringers 1,000 mL with potassium chloride 40 mEq infusion   Intravenous Continuous Gladstone Lighter, MD 100 mL/hr at 05/30/17 0612    . famotidine (PEPCID) tablet 20 mg  20 mg Per Tube QHS Charlett Nose, RPH   20 mg at 05/29/17 2210  . fentaNYL (SUBLIMAZE) bolus via infusion 50 mcg  50 mcg Intravenous Q30 min PRN Varughese, Bincy S, NP      . fentaNYL (SUBLIMAZE) injection 100 mcg  100 mcg Intravenous Once Varughese, Bincy S, NP      . fentaNYL 2535mg in NS 2570m(1021mml) infusion-PREMIX  100-300 mcg/hr Intravenous Continuous Varughese, Bincy S, NP   Stopped at 05/30/17 0730  . heparin injection 5,000 Units  5,000 Units Subcutaneous Q8H Varughese, Bincy S, NP   5,000 Units at 05/30/17 0053  . MEDLINE mouth rinse  15 mL Mouth Rinse 10 times per day SimWilhelmina McardleD   15 mL at 05/30/17 [id number] metroNIDAZOLE (FLAGYL) IVPB 500 mg  500 mg  Intravenous Q8H KalGladstone LighterD 100 mL/hr at 05/30/17 0100 500 mg at 05/30/17 0100  . midazolam (VERSED) 50 mg in sodium chloride 0.9 % 50 mL (1 mg/mL) infusion  2-10 mg/hr Intravenous Continuous Varughese, BinAzucena CecilP   Stopped at 05/29/17 1855  . norepinephrine (LEVOPHED) 16 mg in dextrose 5 % 250 mL (0.064 mg/mL) infusion  0-40 mcg/min Intravenous Titrated  Varughese, Bincy S, NP 1.9 mL/hr at 05/30/17 0600 2.027 mcg/min at 05/30/17 0600  . sodium chloride flush (NS) 0.9 % injection 10-40 mL  10-40 mL Intracatheter PRN Gladstone Lighter, MD      . vancomycin (VANCOCIN) 50 mg/mL oral solution 500 mg  500 mg Per Tube Q6H Wilhelmina Mcardle, MD   500 mg at 05/30/17 0254      Allergies: Allergies  Allergen Reactions  . Meperidine Nausea And Vomiting       . Oxycodone-Acetaminophen Nausea And Vomiting           Past Medical History: Past Medical History:  Diagnosis Date  . Anxiety state 01/22/2011  . Combined forms of age-related cataract of both eyes 03/30/2014  . Diabetes mellitus without complication ([place])   . HTN (hypertension) 08/30/2013  . Hypertension   . Irritable bowel syndrome 01/22/2011  . Microalbuminuria 08/15/2015  . Polyneuropathy associated with underlying disease ([place]) 08/15/2015   Overview:  Gabapentin qhs  . Type 2 diabetes mellitus not at goal [facility]) 08/15/2015   Overview:  Can no longer go to [facility]. Referred to [facility] 3/17     Past Surgical History: Past Surgical History:  Procedure Laterality Date  . CHOLECYSTECTOMY    . COLONOSCOPY WITH PROPOFOL N/A 07/15/2016   Procedure: COLONOSCOPY WITH PROPOFOL;  Surgeon: Lucilla Lame, MD;  Location: [place];  Service: Endoscopy;  Laterality: N/A;  Diabetic - insulin  . EYE SURGERY    . POLYPECTOMY  07/15/2016   Procedure: POLYPECTOMY;  Surgeon: Lucilla Lame, MD;  Location: [place];  Service: Endoscopy;;     Family History: Family History  Problem Relation Age of Onset  . Cancer Mother   .  Diabetes Father   . Stroke Maternal Grandmother      Social History: Social History   Socioeconomic History  . Marital status: Married    Spouse name: Not on file  . Number of children: Not on file  . Years of education: Not on file  . Highest education level: Not on file  Social Needs  . Financial resource strain: Not on file  . Food insecurity - worry: Not on file  . Food insecurity - inability: Not on file  . Transportation needs - medical: Not on file  . Transportation needs - non-medical: Not on file  Occupational History  . Not on file  Tobacco Use  . Smoking status: Never Smoker  . Smokeless tobacco: Never Used  Substance and Sexual Activity  . Alcohol use: Yes    Comment: occasionally: 1-2 drinks/month  . Drug use: No  . Sexual activity: Not on file  Other Topics Concern  . Not on file  Social History Narrative  . Not on file     Review of Systems: Review of Systems  Unable to perform ROS: Critical illness    Vital Signs: Blood pressure (!) 148/50, pulse 74, temperature 98.6 F (37 C), temperature source Bladder, resp. rate 20, height _0  (1.626 m), weight 68 kg (149 lb 14.6 oz), SpO2 100 %.  Weight trends: Filed Weights   05/28/17 0115 05/29/17 0504 05/30/17 0318  Weight: 68.4 kg (150 lb 12.7 oz) 67.1 kg (147 lb 14.9 oz) 68 kg (149 lb 14.6 oz)    Physical Exam: General: Critically ill  Head: ETT  Eyes: Anicteric, PERRL  Neck: Supple  Lungs:  PRVC 30%  Heart: Regular rate and rhythm  Abdomen:  Soft, nontender  Extremities: trace peripheral edema.  Neurologic: Nonfocal,  moving all four extremities  Skin: No lesions  Access: none     Lab results: Basic Metabolic Panel: Recent Labs  Lab 05/29/17 2155 05/30/17 0213 05/30/17 0551  NA 132* 132* 133*  K 4.1 4.4 4.8  CL 105 107 108  CO2 17* 16* 17*  GLUCOSE 124* 140* 156*  BUN 35* 35* 36*  CREATININE 3.34* 3.36* 3.53*  CALCIUM 7.2* 7.2* 7.3*    Liver Function Tests: Recent Labs   Lab 05/19/2017 2220  AST 54*  ALT 26  ALKPHOS 95  BILITOT 0.4  PROT 6.5  ALBUMIN 2.6*   No results for input(s): LIPASE, AMYLASE in the last 168 hours. No results for input(s): AMMONIA in the last 168 hours.  CBC: Recent Labs  Lab 05/22/2017 2220 05/28/17 0307 05/29/17 0416 05/30/17 0406  WBC 10.2 6.3 10.4 12.4*  NEUTROABS 7.3*  --   --   --   HGB 8.1* 7.8* 8.2* 7.4*  HCT 25.5* 23.7* 25.0* 23.5*  MCV 93.2 90.2 89.4 90.9  PLT 232 204 245 266    Cardiac Enzymes: Recent Labs  Lab 05/19/2017 2220 05/28/17 0307 05/28/17 0643 05/28/17 1256 05/28/17 1756  TROPONINI 0.03* 0.06* 0.05* 0.04* 0.04*    BNP: Invalid input(s): POCBNP  CBG: Recent Labs  Lab 05/29/17 2158 05/29/17 2353 05/30/17 0216 05/30/17 0411 05/30/17 0555  GLUCAP 117* 129* 137* 137* 152*    Microbiology: Results for orders placed or performed during the hospital encounter of 05/31/2017  C difficile quick scan w PCR reflex     Status: Abnormal   Collection Time: 05/28/17 12:03 AM  Result Value Ref Range Status   C Diff antigen POSITIVE (A) NEGATIVE Final   C Diff toxin POSITIVE (A) NEGATIVE Final   C Diff interpretation Toxin producing C. difficile detected.  Final    Comment: CRITICAL RESULT CALLED TO, READ BACK BY AND VERIFIED WITH: Tia Masker AT 0142 05/28/17 ALV   Gastrointestinal Panel by PCR , Stool     Status: None   Collection Time: 05/28/17 12:03 AM  Result Value Ref Range Status   Campylobacter species NOT DETECTED NOT DETECTED Final   Plesimonas shigelloides NOT DETECTED NOT DETECTED Final   Salmonella species NOT DETECTED NOT DETECTED Final   Yersinia enterocolitica NOT DETECTED NOT DETECTED Final   Vibrio species NOT DETECTED NOT DETECTED Final   Vibrio cholerae NOT DETECTED NOT DETECTED Final   Enteroaggregative E coli (EAEC) NOT DETECTED NOT DETECTED Final   Enteropathogenic E coli (EPEC) NOT DETECTED NOT DETECTED Final   Enterotoxigenic E coli (ETEC) NOT DETECTED NOT  DETECTED Final   Shiga like toxin producing E coli (STEC) NOT DETECTED NOT DETECTED Final   Shigella/Enteroinvasive E coli (EIEC) NOT DETECTED NOT DETECTED Final   Cryptosporidium NOT DETECTED NOT DETECTED Final   Cyclospora cayetanensis NOT DETECTED NOT DETECTED Final   Entamoeba histolytica NOT DETECTED NOT DETECTED Final   Giardia lamblia NOT DETECTED NOT DETECTED Final   Adenovirus F40/41 NOT DETECTED NOT DETECTED Final   Astrovirus NOT DETECTED NOT DETECTED Final   Norovirus GI/GII NOT DETECTED NOT DETECTED Final   Rotavirus A NOT DETECTED NOT DETECTED Final   Sapovirus (I, II, IV, and V) NOT DETECTED NOT DETECTED Final  MRSA PCR Screening     Status: None   Collection Time: 05/28/17  3:28 AM  Result Value Ref Range Status   MRSA by PCR NEGATIVE NEGATIVE Final    Comment:        The GeneXpert MRSA Assay (FDA  approved for NASAL specimens only), is one component of a comprehensive MRSA colonization surveillance program. It is not intended to diagnose MRSA infection nor to guide or monitor treatment for MRSA infections.   Culture, blood (Routine X 2) w Reflex to ID Panel     Status: None (Preliminary result)   Collection Time: 05/28/17  3:29 AM  Result Value Ref Range Status   Specimen Description BLOOD RIGHT HAND  Final   Special Requests   Final    BOTTLES DRAWN AEROBIC AND ANAEROBIC Blood Culture adequate volume   Culture NO GROWTH 2 DAYS  Final   Report Status PENDING  Incomplete  Culture, blood (Routine X 2) w Reflex to ID Panel     Status: None (Preliminary result)   Collection Time: 05/28/17  3:35 AM  Result Value Ref Range Status   Specimen Description BLOOD LEFT HAND  Final   Special Requests   Final    BOTTLES DRAWN AEROBIC AND ANAEROBIC Blood Culture adequate volume   Culture NO GROWTH 2 DAYS  Final   Report Status PENDING  Incomplete  Culture, respiratory (NON-Expectorated)     Status: None (Preliminary result)   Collection Time: 05/28/17  6:56 AM  Result  Value Ref Range Status   Specimen Description TRACHEAL ASPIRATE  Final   Special Requests NONE  Final   Gram Stain   Final    FEW WBC PRESENT, PREDOMINANTLY PMN MODERATE GRAM POSITIVE COCCI IN CHAINS IN CLUSTERS FEW GRAM NEGATIVE RODS FEW GRAM NEGATIVE COCCOBACILLI FEW GRAM POSITIVE RODS    Culture   Final    CULTURE REINCUBATED FOR BETTER GROWTH Performed at [facility], [place] [address], [place], [place] [address]    Report Status PENDING  Incomplete    Coagulation Studies: Recent Labs    05/28/17 0307 05/28/17 0854  LABPROT 14.1 14.6  INR 1.10 1.15    Urinalysis: Recent Labs    05/29/17 1209  COLORURINE AMBER*  LABSPEC 1.018  PHURINE 5.0  GLUCOSEU 50*  HGBUR LARGE*  BILIRUBINUR NEGATIVE  KETONESUR NEGATIVE  PROTEINUR 100*  NITRITE POSITIVE*  LEUKOCYTESUR LARGE*      Imaging: Ct Head Wo Contrast  Result Date: 05/29/2017 CLINICAL DATA:  Initial evaluation for acute altered mental status, unexplained. Status post cardiac arrest. EXAM: CT HEAD WITHOUT CONTRAST TECHNIQUE: Contiguous axial images were obtained from the base of the skull through the vertex without intravenous contrast. COMPARISON:  Prior CT from 05/28/2017. FINDINGS: Brain: Cerebral volume within normal limits. No acute intracranial hemorrhage. No evidence for acute large vessel territory infarct. Gray-white matter differentiation maintained without evidence for cerebral anoxia or cerebral edema. No mass lesion or midline shift. Ventricles stable in size without hydrocephalus. No extra-axial fluid collection. Vascular: No hyperdense vessel. Skull: Scalp soft tissues and calvarium within normal limits. Sinuses/Orbits: Globes and orbital soft tissues normal. Patient status post lens extraction bilaterally. Minimal layering secretions noted within the left sphenoid sinus. Paranasal sinuses are otherwise clear. A sequelae of prior mastoidectomy noted on the left. Right mastoid air cells clear. Other: None.  IMPRESSION: Stable normal head CT. No acute intracranial abnormality identified. Electronically Signed   By: Jeannine Boga M.D.   On: 05/29/2017 18:50   Dg Chest Port 1 View  Result Date: 05/30/2017 CLINICAL DATA:  Endotracheal tube present. EXAM: PORTABLE CHEST 1 VIEW COMPARISON:  Chest x-rays dated 05/29/2017 and 05/28/2017. FINDINGS: Endotracheal tube is well positioned with tip just above the level of the carina. Right IJ central line is well  position with tip at the level of the lower SVC/ cavoatrial junction. Enteric tube passes below the diaphragm. Heart size and mediastinal contours are stable. Probable mild atelectasis at each lung base. Probable small left pleural effusion. Lungs otherwise clear. No pneumothorax seen. IMPRESSION: 1. Probable mild atelectasis at each lung base. Probable small left pleural effusion. No new lung findings. 2. Support apparatus appears appropriately positioned. Electronically Signed   By: Franki Cabot M.D.   On: 05/30/2017 07:09   Dg Chest Port 1 View  Result Date: 05/29/2017 CLINICAL DATA:  Respiratory failure.  Ventilator support. EXAM: PORTABLE CHEST 1 VIEW COMPARISON:  05/28/2017 FINDINGS: Endotracheal tube tip is 2 cm above the carina. Nasogastric tube enters the abdomen. Right internal jugular central line tip is in the right atrium. Patchy volume loss at the right base persists. There is worsening of volume loss in the left lower lobe. IMPRESSION: Worsened volume loss in the left lower lobe. Central line tip again noted to be in the right atrium. Electronically Signed   By: Nelson Chimes M.D.   On: 05/29/2017 06:33      Assessment & Plan: Ms. ELFREIDA HEGGS is a 61 y.o. white female with coronary artery disease status post CABG on 04/2017, hypertension, hyperlipidemia, diabetes mellitus type II insulin dependent, diabetic neuropathy, irritable bowel syndrome,  who was admitted to [facility] on 05/29/2017 for Cardiac arrest ([place]) [I46.9] PEA (Pulseless  electrical activity) ([place]) [I46.9]  1. Acute renal failure on chronic kidney disease stage III with proteinuria: baseline creatinine of 1.5, GFR of 35 on 05/22/17.  Chronic Kidney Disease secondary to diabetic nephropathy Acute renal failure from ATN. Complicated by metabolic acidosis and hyponatremia No acute indication for dialysis. Discussed dialysis with patient's family.  Currently on IV D5LR with 20KCl at 116m/hr - Change IVF to bicarb gtt at 1069mhr - Check renal ultrasound - monitor urine output, volume status, electrolytes and renal function.   2. Cardiogenic shock, sepsis, hypovolemia and acute colitis: C. Diff positive - on metronidazole - on empiric cefepime and vancomycin.  - Appreciate cards and critical care input.   3. Respiratory failure: requiring mechanical ventilation.    Discussed case with Critical care and Hospitalist.   Discussed plan of care, prognosis and goals of therapy with husband.   LOS: 2 GageSARATH 12/21/20188:45 AM

## 2017-05-30 NOTE — Progress Notes (Signed)
PULMONARY / CRITICAL CARE MEDICINE   Name: Kim Stevens MRN: [id number] DOB: 06/05/1956    HISTORY OF PRESENT ILLNESS:  Kim Stevens is a 61 year old female with known history of anxiety, diabetes mellitus, hypertension, IBS and CAD status post CABG on November 19.  Her hospital course was complicated by cardiac arrest.  Thereafter she was sent to [facility] for rehab.  On 12/18 around 9 PM patient was found to be unresponsive.  EMS was called.  When EMS arrived on the scene patient was pulseless and in asystole.  She received 1 round of epi and 3 rounds of CPR with ROSC.  She had King airway placed by the EMS which was exchanged with an ET tube without any neurological activity.  Her GCS was noted to be 3 and initial EKG rhythm was negative for STEMI. Patient was admitted by the hospitalist.  CCM team was consulted for further management hypothermia protocol initiated at 33 degrees C on 05/28/17.     REVIEW OF SYSTEMS:   Unable to obtain pt intubated   SUBJECTIVE:  Unable to obtain pt intubated   VITAL SIGNS: BP (!) 148/50   Pulse 74   Temp 98.6 F (37 C) (Bladder)   Resp 20   Ht 5\' 4"  (1.626 m)   Wt 68 kg (149 lb 14.6 oz)   SpO2 100%   BMI 25.73 kg/m   HEMODYNAMICS: CVP:  [9 mmHg-15 mmHg] 13 mmHg  VENTILATOR SETTINGS: Vent Mode: PRVC FiO2 (%):  [30 %] 30 % Set Rate:  [14 bmp] 14 bmp Vt Set:  [40 mL-400 mL] 400 mL PEEP:  [5 cmH20] 5 cmH20 Plateau Pressure:  [16 cmH20-18 cmH20] 16 cmH20  INTAKE / OUTPUT: I/O last 3 completed shifts: In: 4284.3 [I.V.:2369.3; Other:90; NG/GT:100; IV Piggyback:1725] Out: 57 [Urine:320; Stool:450]  PHYSICAL EXAMINATION: General: 61 year old female, intubated and on mechanical ventilation Neuro: Unresponsive, no gag reflex, pupils 2 mm very sluggish  HEENT: supple, no JVD  Cardiovascular: S1-S2, sinus rhythm, no M/R/G Lungs: Diminished throughout, no wheezes, crackles or rhonchi Abdomen: Soft, active bowel sounds, non distended   Musculoskeletal: No edema, cyanosis Skin: Chest incision covered with dressings, CDI  LABS:  BMET Recent Labs  Lab 05/29/17 2155 05/30/17 0213 05/30/17 0551  NA 132* 132* 133*  K 4.1 4.4 4.8  CL 105 107 108  CO2 17* 16* 17*  BUN 35* 35* 36*  CREATININE 3.34* 3.36* 3.53*  GLUCOSE 124* 140* 156*    Electrolytes Recent Labs  Lab 05/29/17 2155 05/30/17 0213 05/30/17 0551  CALCIUM 7.2* 7.2* 7.3*    CBC Recent Labs  Lab 05/28/17 0307 05/29/17 0416 05/30/17 0406  WBC 6.3 10.4 12.4*  HGB 7.8* 8.2* 7.4*  HCT 23.7* 25.0* 23.5*  PLT 204 245 266    Coag's Recent Labs  Lab 05/28/17 0307 05/28/17 0854  APTT 44* 40*  INR 1.10 1.15    Sepsis Markers Recent Labs  Lab 05/28/17 0307 05/28/17 0308 05/29/17 0416 05/30/17 0406  LATICACIDVEN  --  0.7  --   --   PROCALCITON 15.05  --  69.86 46.04    ABG Recent Labs  Lab 06/07/2017 2319 05/28/17 0408  PHART 7.37 7.53*  PCO2ART 33 28*  PO2ART 63* 123*    Liver Enzymes Recent Labs  Lab 06/06/2017 2220  AST 54*  ALT 26  ALKPHOS 95  BILITOT 0.4  ALBUMIN 2.6*    Cardiac Enzymes Recent Labs  Lab 05/28/17 0643 05/28/17 1256 05/28/17 1756  TROPONINI 0.05*  0.04* 0.04*    Glucose Recent Labs  Lab 05/29/17 1957 05/29/17 2158 05/29/17 2353 05/30/17 0216 05/30/17 0411 05/30/17 0555  GLUCAP 116* 117* 129* 137* 137* 152*    Imaging Ct Head Wo Contrast  Result Date: 05/29/2017 CLINICAL DATA:  Initial evaluation for acute altered mental status, unexplained. Status post cardiac arrest. EXAM: CT HEAD WITHOUT CONTRAST TECHNIQUE: Contiguous axial images were obtained from the base of the skull through the vertex without intravenous contrast. COMPARISON:  Prior CT from 05/28/2017. FINDINGS: Brain: Cerebral volume within normal limits. No acute intracranial hemorrhage. No evidence for acute large vessel territory infarct. Gray-white matter differentiation maintained without evidence for cerebral anoxia or  cerebral edema. No mass lesion or midline shift. Ventricles stable in size without hydrocephalus. No extra-axial fluid collection. Vascular: No hyperdense vessel. Skull: Scalp soft tissues and calvarium within normal limits. Sinuses/Orbits: Globes and orbital soft tissues normal. Patient status post lens extraction bilaterally. Minimal layering secretions noted within the left sphenoid sinus. Paranasal sinuses are otherwise clear. A sequelae of prior mastoidectomy noted on the left. Right mastoid air cells clear. Other: None. IMPRESSION: Stable normal head CT. No acute intracranial abnormality identified. Electronically Signed   By: Jeannine Boga M.D.   On: 05/29/2017 18:50   US Renal  Result Date: 05/30/2017 CLINICAL DATA:  Acute kidney failure superimposed on chronic kidney disease. EXAM: RENAL / URINARY TRACT ULTRASOUND COMPLETE COMPARISON:  CT, 12/10/2011 FINDINGS: Right Kidney: Length: 10.1 cm. Mild increase in parenchymal echogenicity. No mass or stone. No hydronephrosis. Left Kidney: Length: 10.1 cm. Echogenicity within normal limits. No mass or hydronephrosis visualized. Bladder: Not well distended.  Grossly unremarkable. IMPRESSION: 1. No acute findings.  No hydronephrosis.  No renal masses. 2. Mild increased right renal parenchymal echogenicity consistent with medical renal disease. No other abnormality. Electronically Signed   By: Lajean Manes M.D.   On: 05/30/2017 11:06   Dg Chest Port 1 View  Result Date: 05/30/2017 CLINICAL DATA:  Endotracheal tube present. EXAM: PORTABLE CHEST 1 VIEW COMPARISON:  Chest x-rays dated 05/29/2017 and 05/28/2017. FINDINGS: Endotracheal tube is well positioned with tip just above the level of the carina. Right IJ central line is well position with tip at the level of the lower SVC/ cavoatrial junction. Enteric tube passes below the diaphragm. Heart size and mediastinal contours are stable. Probable mild atelectasis at each lung base. Probable small left  pleural effusion. Lungs otherwise clear. No pneumothorax seen. IMPRESSION: 1. Probable mild atelectasis at each lung base. Probable small left pleural effusion. No new lung findings. 2. Support apparatus appears appropriately positioned. Electronically Signed   By: Franki Cabot M.D.   On: 05/30/2017 07:09   STUDIES:  05/26/2017 CTPE>>Negative for acute pulmonary embolus.. Cardiomegaly with post CABG changes.  Trace pericardial fluid. Small pleural effusions. Partial consolidations in the lower lobes, may reflect atelectasis, or mild pneumonia.. Small gas collection within the superficial soft tissues anteriorto the xiphoid process, may be contiguous with skin surface/sternalwound. No significant fluid collection in this region to suggestsoft tissue abscess. 12/19 CT head>> Normal brain 1128/2018 ECHO>> LVEF 45% 12/21 CT Head>>NEG   CULTURES: Blood culture 12/19>>negative Sputum Culture 12/19>> GI Panel 12/19>>negative  Cdiff PCR 12/19>>positive   ANTIBIOTICS: Vancomycin 12/19>> Metronidazole 12/20>> Cefepime 12/20>>  SIGNIFICANT EVENTS: 11/19 CABG at [facility] 12/12 PEA arrest 12/19 PEA arrest mechanically intubated hypothermic protocol initiated at 33 degrees C 12/20 Rewarming complete   LINES/TUBES: 12/19 ET tube 12/19 Right IJ CVL   ASSESSMENT / PLAN:  PULMONARY  A: Acute Respiratory failure s/p PEA arrest  Hx of Pulmonary Hypertension P:   Full vent support VAP bundle Implemented SBT trials when tolerated  CARDIOVASCULAR A:  PEA arrest Hypotension likely component of hypovolemia and sepsis  CAD S/P CABG P:  Continuous telemetry monitoring  Keep MAP goal>65 Cardiology consulted appreciate input   RENAL A:   Acute Kidney Injury P:   Strict I/O Follow BMP Replace electrolytes Monitor UOP  Nephrology consulted appreciate input-may need dialysis in 24 hrs if renal function does not improve  Sodium Bicarb 150 meq in D5W@ 100 ml/hr    GASTROINTESTINAL A:   C-diff P:   Continue vancomycin will add metronidazole Famotidine for GIP Will start trickle feeds-05/30/17   HEMATOLOGIC A:   Anemia without acute blood loss  P: Subq heparin for VTE prophylaxis  H&H q6hrs  Monitor for s/sx of bleeding  Will transfuse if Hgb<7   INFECTIOUS A:   C-diff positive P:   Trend WBC and monitor fever curve Trend PCT Follow cultures Continue abx as listed above  ENDOCRINE A:   DM P:   Blood Glucose checks with SSI coverage  NEUROLOGIC A:   Severe Encephalopathy Post cardiac arrest P:   Maintain RASS goal 0 to -1 Prn Fentanyl to maintain RASS goal Neurology consulted-if mentation does not improve over the next 72 hrs will order MRI Brain  WUA daily   FAMILY  - Updates: Pts family updated regarding plan of care and all questions answered.  Marda Stalker, [place] Pager [phone] (please enter 7 digits) PCCM Consult Pager [phone] (please enter 7 digits)

## 2017-05-30 NOTE — Progress Notes (Signed)
Nutrition Follow Up Note   DOCUMENTATION CODES:   Not applicable  INTERVENTION:   Initiate Vital HP at trickle rate of 75ml/hr + Prostat 101ml QID- regimen provides 880kcal/day, 102g/day protein, 435ml free water  MVI daily via tube   Recommend monitor K, Mg, and P as pt likely at refeeding risk.   NUTRITION DIAGNOSIS:   Inadequate oral intake related to inability to eat as evidenced by NPO status.  GOAL:   Provide needs based on ASPEN/SCCM guidelines  -progressing   MONITOR:   Vent status, Labs, Weight trends, TF tolerance, Skin, I & O's  ASSESSMENT:   61 year old female with PMHx of DM type 2, IBS, polyneuropathy, anxiety, HTN, hx cholecystectomy, CAD s/p CABG 61/95/0932 complicated by cardiac arrest post-op who was then sent to [place] for STR. On 12/18 patient was found unresponsive, had suffered unwitnessed cardiopulmonary arrest, s/p 3 rounds of CPR with ROSC and was intubated in the field. Patient was started on hypothermia protocol (33 C) on 12/19 at 0130.   Pt with poor prognosis. Neurology plans to see her today; pt may need MRI. Renal values worse; pt may possibly need CRRT. Per MD, plan to initiate trickle feeds today. Pt likely at refeeding risk; recommend monitor K, Mg, and P. Will add Prostat to help pt meet estimated protein needs. Per chart, pt remains weight stable.    Medications reviewed and include: aspirin, pepcid, heparin, fentanyl, heparin, vancomycin, cefepime, metronidazole, levophed, Na Bicarb  Labs reviewed: Na 133(L), BUN 36(H), creat 3.53(H), Ca 7.3(L) Wbc- 12.4(H), Hgb 7.4(L), Hct 23.5(L) cbgs- 140, 156 x 24hrs  Patient is currently intubated on ventilator support MV: 8.6 L/min Temp (24hrs), Avg:98.4 F (36.9 C), Min:97.3 F (36.3 C), Max:98.8 F (37.1 C)  Propofol: none   MAP- >40mm/Hg  Diet Order:  Diet NPO time specified  EDUCATION NEEDS:   No education needs have been identified at this time  Skin:  Incisions: open incisions to chest  Last BM:  12/21- type 7   Height:   Ht Readings from Last 1 Encounters:  05/28/17 5\' 4"  (1.626 m)    Weight:   Wt Readings from Last 1 Encounters:  05/30/17 149 lb 14.6 oz (68 kg)    Ideal Body Weight:  54.5 kg  BMI:  Body mass index is 25.73 kg/m.  Estimated Nutritional Needs:   Kcal:  1331 (PSU 2003b w/ MSJ 6712, Ve 5.5, Tmax 37 as pt on cooling protocol)  Protein:  90-110 grams (1.3-1.6 grams/kg)  Fluid:  2-2.4 L/day (30-35 mL/kg)  Koleen Distance MS, RD, LDN Pager #- [phone] After Hours Pager: [phone]

## 2017-05-31 LAB — PROTIME-INR
INR: 1.27
PROTHROMBIN TIME: 15.8 s — AB (ref 11.4–15.2)

## 2017-05-31 LAB — BASIC METABOLIC PANEL
ANION GAP: 7 (ref 5–15)
ANION GAP: 7 (ref 5–15)
ANION GAP: 6 (ref 5–15)
ANION GAP: 9 (ref 5–15)
Anion gap: 8 (ref 5–15)
BUN: 42 mg/dL — AB (ref 6–20)
BUN: 42 mg/dL — AB (ref 6–20)
BUN: 41 mg/dL — ABNORMAL HIGH (ref 6–20)
BUN: 46 mg/dL — ABNORMAL HIGH (ref 6–20)
BUN: 48 mg/dL — ABNORMAL HIGH (ref 6–20)
CALCIUM: 7.1 mg/dL — AB (ref 8.9–10.3)
CALCIUM: 6.8 mg/dL — AB (ref 8.9–10.3)
CHLORIDE: 105 mmol/L (ref 101–111)
CHLORIDE: 105 mmol/L (ref 101–111)
CO2: 21 mmol/L — ABNORMAL LOW (ref 22–32)
CO2: 21 mmol/L — AB (ref 22–32)
CO2: 23 mmol/L (ref 22–32)
CO2: 26 mmol/L (ref 22–32)
CO2: 24 mmol/L (ref 22–32)
Calcium: 7.1 mg/dL — ABNORMAL LOW (ref 8.9–10.3)
Calcium: 7.1 mg/dL — ABNORMAL LOW (ref 8.9–10.3)
Calcium: 6.8 mg/dL — ABNORMAL LOW (ref 8.9–10.3)
Chloride: 103 mmol/L (ref 101–111)
Chloride: 101 mmol/L (ref 101–111)
Chloride: 101 mmol/L (ref 101–111)
Creatinine, Ser: 3.83 mg/dL — ABNORMAL HIGH (ref 0.44–1.00)
Creatinine, Ser: 3.89 mg/dL — ABNORMAL HIGH (ref 0.44–1.00)
Creatinine, Ser: 3.87 mg/dL — ABNORMAL HIGH (ref 0.44–1.00)
Creatinine, Ser: 4.03 mg/dL — ABNORMAL HIGH (ref 0.44–1.00)
Creatinine, Ser: 3.92 mg/dL — ABNORMAL HIGH (ref 0.44–1.00)
GFR calc Af Amer: 13 mL/min — ABNORMAL LOW (ref >60)
GFR calc non Af Amer: 12 mL/min — ABNORMAL LOW (ref >60)
GFR, EST AFRICAN AMERICAN: 14 mL/min — AB (ref >60)
GFR, EST AFRICAN AMERICAN: 14 mL/min — AB (ref >60)
GFR, EST AFRICAN AMERICAN: 13 mL/min — AB (ref >60)
GFR, EST AFRICAN AMERICAN: 13 mL/min — AB (ref >60)
GFR, EST NON AFRICAN AMERICAN: 12 mL/min — AB (ref >60)
GFR, EST NON AFRICAN AMERICAN: 12 mL/min — AB (ref >60)
GFR, EST NON AFRICAN AMERICAN: 11 mL/min — AB (ref >60)
GFR, EST NON AFRICAN AMERICAN: 12 mL/min — AB (ref >60)
GLUCOSE: 184 mg/dL — AB (ref 65–99)
GLUCOSE: 221 mg/dL — AB (ref 65–99)
GLUCOSE: 251 mg/dL — AB (ref 65–99)
Glucose, Bld: 195 mg/dL — ABNORMAL HIGH (ref 65–99)
Glucose, Bld: 178 mg/dL — ABNORMAL HIGH (ref 65–99)
POTASSIUM: 4 mmol/L (ref 3.5–5.1)
POTASSIUM: 3.8 mmol/L (ref 3.5–5.1)
POTASSIUM: 3.6 mmol/L (ref 3.5–5.1)
POTASSIUM: 3.7 mmol/L (ref 3.5–5.1)
Potassium: 4 mmol/L (ref 3.5–5.1)
SODIUM: 133 mmol/L — AB (ref 135–145)
SODIUM: 134 mmol/L — AB (ref 135–145)
SODIUM: 134 mmol/L — AB (ref 135–145)
Sodium: 133 mmol/L — ABNORMAL LOW (ref 135–145)
Sodium: 133 mmol/L — ABNORMAL LOW (ref 135–145)

## 2017-05-31 LAB — RENAL FUNCTION PANEL
ALBUMIN: 1.8 g/dL — AB (ref 3.5–5.0)
ANION GAP: 8 (ref 5–15)
BUN: 41 mg/dL — ABNORMAL HIGH (ref 6–20)
CALCIUM: 7.1 mg/dL — AB (ref 8.9–10.3)
CO2: 22 mmol/L (ref 22–32)
CREATININE: 3.85 mg/dL — AB (ref 0.44–1.00)
Chloride: 104 mmol/L (ref 101–111)
GFR calc non Af Amer: 12 mL/min — ABNORMAL LOW (ref >60)
GFR, EST AFRICAN AMERICAN: 14 mL/min — AB (ref >60)
Glucose, Bld: 182 mg/dL — ABNORMAL HIGH (ref 65–99)
PHOSPHORUS: 4.4 mg/dL (ref 2.5–4.6)
Potassium: 3.9 mmol/L (ref 3.5–5.1)
SODIUM: 134 mmol/L — AB (ref 135–145)

## 2017-05-31 LAB — CBC
HEMATOCRIT: 23.6 % — AB (ref 35.0–47.0)
Hemoglobin: 7.7 g/dL — ABNORMAL LOW (ref 12.0–16.0)
MCH: 28.8 pg (ref 26.0–34.0)
MCHC: 32.6 g/dL (ref 32.0–36.0)
MCV: 88.6 fL (ref 80.0–100.0)
PLATELETS: 207 10*3/uL (ref 150–440)
RBC: 2.67 MIL/uL — AB (ref 3.80–5.20)
RDW: 17 % — AB (ref 11.5–14.5)
WBC: 10.9 10*3/uL (ref 3.6–11.0)

## 2017-05-31 LAB — APTT: APTT: 53 s — AB (ref 24–36)

## 2017-05-31 LAB — GLUCOSE, CAPILLARY
GLUCOSE-CAPILLARY: 162 mg/dL — AB (ref 65–99)
GLUCOSE-CAPILLARY: 231 mg/dL — AB (ref 65–99)
GLUCOSE-CAPILLARY: 248 mg/dL — AB (ref 65–99)
Glucose-Capillary: 171 mg/dL — ABNORMAL HIGH (ref 65–99)
Glucose-Capillary: 226 mg/dL — ABNORMAL HIGH (ref 65–99)

## 2017-05-31 LAB — MAGNESIUM: Magnesium: 1.3 mg/dL — ABNORMAL LOW (ref 1.7–2.4)

## 2017-05-31 LAB — OCCULT BLOOD X 1 CARD TO LAB, STOOL: Fecal Occult Bld: NEGATIVE

## 2017-05-31 LAB — ABO/RH: ABO/RH(D): A NEG

## 2017-05-31 LAB — PREPARE RBC (CROSSMATCH)

## 2017-05-31 MED ORDER — PANTOPRAZOLE SODIUM 40 MG IV SOLR
40.0000 mg | Freq: Two times a day (BID) | INTRAVENOUS | Status: DC
  Administered 2017-05-31 – 2017-06-01 (×5): 40 mg via INTRAVENOUS
  Filled 2017-05-31 (×6): qty 40

## 2017-05-31 MED ORDER — SODIUM CHLORIDE 0.9 % IV SOLN
Freq: Once | INTRAVENOUS | Status: AC
  Administered 2017-05-31: 03:00:00 via INTRAVENOUS

## 2017-05-31 MED ORDER — HEPARIN SODIUM (PORCINE) 5000 UNIT/ML IJ SOLN
5000.0000 [IU] | Freq: Three times a day (TID) | INTRAMUSCULAR | Status: DC
  Administered 2017-05-31 – 2017-06-02 (×7): 5000 [IU] via SUBCUTANEOUS
  Filled 2017-05-31 (×7): qty 1

## 2017-05-31 NOTE — Progress Notes (Addendum)
[facility]  ROUNDING NOTE   Subjective:   UOP 666m.   Creatinine 3.85 (3.83)  Objective:  Vital signs in last 24 hours:  Temp:  [98.6 F (37 C)] 98.6 F (37 C) (12/22 0554) Pulse Rate:  [65-75] 67 (12/22 0730) Resp:  [0-23] 0 (12/22 0730) BP: (101-165)/(43-57) 122/47 (12/22 0730) SpO2:  [94 %-100 %] 100 % (12/22 0730) FiO2 (%):  [30 %] 30 % (12/22 0554) Weight:  [72.3 kg (159 lb 6.3 oz)] 72.3 kg (159 lb 6.3 oz) (12/22 0251)  Weight change: 4.3 kg (9 lb 7.7 oz) Filed Weights   05/29/17 0504 05/30/17 0318 05/31/17 0251  Weight: 67.1 kg (147 lb 14.9 oz) 68 kg (149 lb 14.6 oz) 72.3 kg (159 lb 6.3 oz)    Intake/Output: I/O last 3 completed shifts: In: 4112.5 [I.V.:3349.5; Blood:340; NG/GT:323; IV Piggyback:100] Out: 1260 [Urine:685; Stool:575]   Intake/Output this shift:  No intake/output data recorded.  Physical Exam: - Off sedation, unresponsive, no corneal reflex, no gag reflex, no DTR, no dullness.  Triggering the ventilator and no spontaneous movements.  Detailed of the neurological exam as per neurology  - On vent, no distress, bilateral equal air entry and no adventitious sounds.  Minimal clear secretions.  The rest superficial dehiscence of median sternotomy.  - S1 and S2 are audible no murmur  - Benign abdominal exam was febrile peristalsis.  Tolerating tube feeding  - Extremities exam normal was no peripheral edema.                       Basic Metabolic Panel: Recent Labs  Lab 05/30/17 0551 05/30/17 1316 05/30/17 2142 05/31/17 0119 05/31/17 0549  NA 133* 134* 136 133* 134*  K 4.8 4.1 4.2 4.0 3.9  CL 108 111 109 105 104  CO2 17* 15* 21* 21* 22  GLUCOSE 156* 143* 157* 195* 182*  BUN 36* 34* 39* 42* 41*  CREATININE 3.53* 3.25* 3.71* 3.83* 3.85*  CALCIUM 7.3* 6.5* 7.4* 7.1* 7.1*  MG  --   --   --   --  1.3*  PHOS  --   --   --   --  4.4    Liver Function Tests: Recent Labs  Lab 05/10/2017 2220 05/30/17 1316 05/31/17 0549  AST  54* 36  --   ALT 26 12*  --   ALKPHOS 95 73  --   BILITOT 0.4 0.4  --   PROT 6.5 4.5*  --   ALBUMIN 2.6* 1.6* 1.8*   No results for input(s): LIPASE, AMYLASE in the last 168 hours. No results for input(s): AMMONIA in the last 168 hours.  CBC: Recent Labs  Lab 05/23/2017 2220 05/28/17 0307 05/29/17 0416 05/30/17 0406 05/30/17 1316 05/30/17 2337  WBC 10.2 6.3 10.4 12.4*  --   --   NEUTROABS 7.3*  --   --   --   --   --   HGB 8.1* 7.8* 8.2* 7.4* 7.0* 6.6*  HCT 25.5* 23.7* 25.0* 23.5* 21.5* 20.0*  MCV 93.2 90.2 89.4 90.9  --   --   PLT 232 204 245 266  --   --     Cardiac Enzymes: Recent Labs  Lab 05/11/2017 2220 05/28/17 0307 05/28/17 0643 05/28/17 1256 05/28/17 1756  TROPONINI 0.03* 0.06* 0.05* 0.04* 0.04*    BNP: Invalid input(s): POCBNP  CBG: Recent Labs  Lab 05/30/17 1636 05/30/17 1918 05/30/17 2333 05/31/17 0355 05/31/17 0710  GLUCAP 153* 162* 183* 162* 171*  Microbiology: Results for orders placed or performed during the hospital encounter of 05/18/2017  C difficile quick scan w PCR reflex     Status: Abnormal   Collection Time: 05/28/17 12:03 AM  Result Value Ref Range Status   C Diff antigen POSITIVE (A) NEGATIVE Final   C Diff toxin POSITIVE (A) NEGATIVE Final   C Diff interpretation Toxin producing C. difficile detected.  Final    Comment: CRITICAL RESULT CALLED TO, READ BACK BY AND VERIFIED WITH: Tia Masker AT 0142 05/28/17 ALV   Gastrointestinal Panel by PCR , Stool     Status: None   Collection Time: 05/28/17 12:03 AM  Result Value Ref Range Status   Campylobacter species NOT DETECTED NOT DETECTED Final   Plesimonas shigelloides NOT DETECTED NOT DETECTED Final   Salmonella species NOT DETECTED NOT DETECTED Final   Yersinia enterocolitica NOT DETECTED NOT DETECTED Final   Vibrio species NOT DETECTED NOT DETECTED Final   Vibrio cholerae NOT DETECTED NOT DETECTED Final   Enteroaggregative E coli (EAEC) NOT DETECTED NOT DETECTED Final    Enteropathogenic E coli (EPEC) NOT DETECTED NOT DETECTED Final   Enterotoxigenic E coli (ETEC) NOT DETECTED NOT DETECTED Final   Shiga like toxin producing E coli (STEC) NOT DETECTED NOT DETECTED Final   Shigella/Enteroinvasive E coli (EIEC) NOT DETECTED NOT DETECTED Final   Cryptosporidium NOT DETECTED NOT DETECTED Final   Cyclospora cayetanensis NOT DETECTED NOT DETECTED Final   Entamoeba histolytica NOT DETECTED NOT DETECTED Final   Giardia lamblia NOT DETECTED NOT DETECTED Final   Adenovirus F40/41 NOT DETECTED NOT DETECTED Final   Astrovirus NOT DETECTED NOT DETECTED Final   Norovirus GI/GII NOT DETECTED NOT DETECTED Final   Rotavirus A NOT DETECTED NOT DETECTED Final   Sapovirus (I, II, IV, and V) NOT DETECTED NOT DETECTED Final  MRSA PCR Screening     Status: None   Collection Time: 05/28/17  3:28 AM  Result Value Ref Range Status   MRSA by PCR NEGATIVE NEGATIVE Final    Comment:        The GeneXpert MRSA Assay (FDA approved for NASAL specimens only), is one component of a comprehensive MRSA colonization surveillance program. It is not intended to diagnose MRSA infection nor to guide or monitor treatment for MRSA infections.   Culture, blood (Routine X 2) w Reflex to ID Panel     Status: None (Preliminary result)   Collection Time: 05/28/17  3:29 AM  Result Value Ref Range Status   Specimen Description BLOOD RIGHT HAND  Final   Special Requests   Final    BOTTLES DRAWN AEROBIC AND ANAEROBIC Blood Culture adequate volume   Culture   Final    NO GROWTH 3 DAYS Performed at [facility], [address] [place], [place] [address]    Report Status PENDING  Incomplete  Culture, blood (Routine X 2) w Reflex to ID Panel     Status: None (Preliminary result)   Collection Time: 05/28/17  3:35 AM  Result Value Ref Range Status   Specimen Description BLOOD LEFT HAND  Final   Special Requests   Final    BOTTLES DRAWN AEROBIC AND ANAEROBIC Blood Culture adequate  volume   Culture   Final    NO GROWTH 3 DAYS Performed at [facility], [address] [place], [place] [address]    Report Status PENDING  Incomplete  Culture, respiratory (NON-Expectorated)     Status: None   Collection Time: 05/28/17  6:56  AM  Result Value Ref Range Status   Specimen Description   Final    TRACHEAL ASPIRATE Performed at [facility], [address] [place], [place] [address]    Special Requests   Final    NONE Performed at [facility], [address] [place], [place] [address]    Gram Stain   Final    FEW WBC PRESENT, PREDOMINANTLY PMN MODERATE GRAM POSITIVE COCCI IN CHAINS IN CLUSTERS FEW GRAM NEGATIVE RODS FEW GRAM NEGATIVE COCCOBACILLI FEW GRAM POSITIVE RODS    Culture   Final    Consistent with normal respiratory flora. Performed at [facility], [place] [address] [place], [place] [address]    Report Status 05/30/2017 FINAL  Final  Urine Culture     Status: Abnormal   Collection Time: 05/29/17 12:09 PM  Result Value Ref Range Status   Specimen Description   Final    URINE, CLEAN CATCH Performed at [facility], [address], [place], [place] [address]    Special Requests   Final    NONE Performed at [facility], [address] [place], [place] [address]    Culture MULTIPLE SPECIES PRESENT, SUGGEST RECOLLECTION (A)  Final   Report Status 05/30/2017 FINAL  Final    Coagulation Studies: Recent Labs    05/28/17 0854 05/31/17 0119  LABPROT 14.6 15.8*  INR 1.15 1.27    Urinalysis: Recent Labs    05/29/17 1209  COLORURINE AMBER*  LABSPEC 1.018  PHURINE 5.0  GLUCOSEU 50*  HGBUR LARGE*  BILIRUBINUR NEGATIVE  KETONESUR NEGATIVE  PROTEINUR 100*  NITRITE POSITIVE*  LEUKOCYTESUR LARGE*      Imaging: Ct Head Wo Contrast  Result Date: 05/29/2017 CLINICAL DATA:  Initial evaluation for acute altered mental status, unexplained. Status post cardiac arrest. EXAM: CT HEAD WITHOUT  CONTRAST TECHNIQUE: Contiguous axial images were obtained from the base of the skull through the vertex without intravenous contrast. COMPARISON:  Prior CT from 05/28/2017. FINDINGS: Brain: Cerebral volume within normal limits. No acute intracranial hemorrhage. No evidence for acute large vessel territory infarct. Gray-white matter differentiation maintained without evidence for cerebral anoxia or cerebral edema. No mass lesion or midline shift. Ventricles stable in size without hydrocephalus. No extra-axial fluid collection. Vascular: No hyperdense vessel. Skull: Scalp soft tissues and calvarium within normal limits. Sinuses/Orbits: Globes and orbital soft tissues normal. Patient status post lens extraction bilaterally. Minimal layering secretions noted within the left sphenoid sinus. Paranasal sinuses are otherwise clear. A sequelae of prior mastoidectomy noted on the left. Right mastoid air cells clear. Other: None. IMPRESSION: Stable normal head CT. No acute intracranial abnormality identified. Electronically Signed   By: Jeannine Boga M.D.   On: 05/29/2017 18:50   US Renal  Result Date: 05/30/2017 CLINICAL DATA:  Acute kidney failure superimposed on chronic kidney disease. EXAM: RENAL / URINARY TRACT ULTRASOUND COMPLETE COMPARISON:  CT, 12/10/2011 FINDINGS: Right Kidney: Length: 10.1 cm. Mild increase in parenchymal echogenicity. No mass or stone. No hydronephrosis. Left Kidney: Length: 10.1 cm. Echogenicity within normal limits. No mass or hydronephrosis visualized. Bladder: Not well distended.  Grossly unremarkable. IMPRESSION: 1. No acute findings.  No hydronephrosis.  No renal masses. 2. Mild increased right renal parenchymal echogenicity consistent with medical renal disease. No other abnormality. Electronically Signed   By: Lajean Manes M.D.   On: 05/30/2017 11:06   Dg Chest Port 1 View  Result Date: 05/30/2017 CLINICAL DATA:  Endotracheal tube present. EXAM: PORTABLE CHEST 1 VIEW  COMPARISON:  Chest x-rays dated 05/29/2017 and 05/28/2017. FINDINGS: Endotracheal tube is well positioned with tip just above the level of the carina. Right IJ central line is well position with tip at the level of the lower SVC/ cavoatrial junction. Enteric tube passes below the diaphragm. Heart size and mediastinal contours are stable. Probable mild atelectasis at each lung base. Probable small left pleural effusion. Lungs otherwise clear. No pneumothorax seen. IMPRESSION: 1. Probable mild atelectasis at each lung base. Probable small left pleural effusion. No new lung findings. 2. Support apparatus appears appropriately positioned. Electronically Signed   By: Franki Cabot M.D.   On: 05/30/2017 07:09     Medications:   . sodium chloride    . ceFEPime (MAXIPIME) IV Stopped (05/30/17 1311)  . feeding supplement (VITAL HIGH PROTEIN) 1,000 mL (05/31/17 0700)  . fentaNYL Stopped (05/30/17 0730)  . metronidazole Stopped (05/31/17 0238)  . norepinephrine (LEVOPHED) Adult infusion Stopped (05/30/17 1500)  .  sodium bicarbonate  infusion 1000 mL 100 mL/hr at 05/31/17 0700   . aspirin  81 mg Per Tube Daily  . chlorhexidine gluconate (MEDLINE KIT)  15 mL Mouth Rinse BID  . famotidine  20 mg Per Tube QHS  . feeding supplement (PRO-STAT SUGAR FREE 64)  30 mL Per Tube QID  . fentaNYL (SUBLIMAZE) injection  100 mcg Intravenous Once  . mouth rinse  15 mL Mouth Rinse 10 times per day  . multivitamin  15 mL Per Tube Daily  . pantoprazole (PROTONIX) IV  40 mg Intravenous Q12H  . vancomycin  500 mg Per Tube Q6H   sodium chloride, fentaNYL, sodium chloride flush  Assessment/ Plan:  Ms. Kim Stevens is a 61 y.o.  female with coronary artery disease status post CABG on 04/2017, hypertension, hyperlipidemia, diabetes mellitus type II insulin dependent, diabetic neuropathy, irritable bowel syndrome,  who was admitted to [facility] on 05/15/2017 for Cardiac arrest  -Acute respiratory failure.  Patient continues to  have poor weaning parameters because of poor neurologic and exam status post cardiac arrest.  Continuous ventilator support.  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  No acute intracranial abnormalities and CT head.  Status post induced hypothermia.  Continue to monitor neuro status and follow with neurology consult.  -Status post cardiac arrest.  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  Management as per cardiology no further invasive cardiac workup was advised.  - Acute renal failure on chronic kidney disease stage III with proteinuria: baseline creatinine of 1.5, GFR of 35 on 05/22/17.  Has been on a bicarbonate drip as per renal .   -C. difficile colitis.  On Flagyl and vancomycin p.o.  -UTI.  On cefepime, possible contaminated sample on urine culture.  -Anemia.  Monitor H&H and hemoglobin more than 7 g/dL.  -Full code.    -DVT and GI prophylaxis.  Continue with supportive care.  Mr. Donovan Kail was updated at the bedside was her poor neurological exam and the plan of care to follow with neurology for the likelihood of meaningful neurological recovery.  Medical care time 40 minutes      LOS: [place], Babbie 12/22/20187:45 AM

## 2017-05-31 NOTE — Progress Notes (Signed)
Name: Kim Stevens MRN: [id number] DOB: 08/29/1955     CONSULTATION DATE: 06/02/2017 Subjective: No major issues last night  Objective: Patient remains on a ventilator, off sedation no change of neuro exam and tolerating tube feeding.  Prior to Admission medications   Medication Sig Start Date End Date Taking? Authorizing Provider  acetaminophen (TYLENOL) 325 MG tablet Take 650 mg by mouth every 6 (six) hours as needed for mild pain.   Yes [provider]  albuterol (PROVENTIL) (2.5 MG/3ML) 0.083% nebulizer solution Take 2.5 mg by nebulization 2 (two) times daily as needed for wheezing or shortness of breath.   Yes [provider]  antiseptic oral rinse (BIOTENE) LIQD 15 mLs by Mouth Rinse route every 2 (two) hours as needed for dry mouth.   Yes [provider]  atorvastatin (LIPITOR) 20 MG tablet Take 20 mg by mouth daily at 6 PM.   Yes [provider]  bisacodyl (DULCOLAX) 10 MG suppository Place 10 mg rectally daily as needed for moderate constipation.   Yes [provider]  cholecalciferol (VITAMIN D) 1000 units tablet Take 1,000 Units by mouth daily.   Yes [provider]  Cold Sore Products (BLISTEX EX) Apply 1 application topically every 2 (two) hours as needed (dry lips).   Yes [provider]  gabapentin (NEURONTIN) 300 MG capsule Take 300 mg by mouth every 6 (six) hours as needed (neuropathy).   Yes [provider]  Glucagon, rDNA, (GLUCAGON EMERGENCY IJ) Inject 0.25 mLs as directed as needed (low blood sugar).   Yes [provider]  heparin 10000 UNIT/ML injection Inject 5,000 Units into the skin every 8 (eight) hours.   Yes [provider]  insulin glargine (LANTUS) 100 UNIT/ML injection Inject 14 Units into the skin at bedtime.    Yes [provider]  insulin lispro (HUMALOG) 100 UNIT/ML injection Inject 0-10 Units into the skin 4 (four) times daily. 0-150= 0 units 151-200= 2  units 201-250= 4 units 251-300= 6 units 301-350= 8 units Greater than 350= 10 units. Recheck in 2 hours. Call MD   Yes [provider]  Lactobacillus Rhamnosus, GG, (CULTURELLE) CAPS Take 1 capsule by mouth daily.   Yes [provider]  levothyroxine (SYNTHROID, LEVOTHROID) 25 MCG tablet Take 25 mcg by mouth daily before breakfast.   Yes [provider]  LORazepam (ATIVAN) 0.5 MG tablet Take 0.5 mg by mouth every 12 (twelve) hours as needed for anxiety.   Yes [provider]  Melatonin 3 MG TABS Take 3 mg by mouth at bedtime.   Yes [provider]  polyethylene glycol (MIRALAX / GLYCOLAX) packet Take 17 g by mouth daily as needed for mild constipation.   Yes [provider]  sildenafil (REVATIO) 20 MG tablet Take 20 mg by mouth 3 (three) times daily.   Yes [provider]  sitaGLIPtin (JANUVIA) 50 MG tablet Take 50 mg by mouth daily.   Yes [provider]  spironolactone (ALDACTONE) 25 MG tablet Take 25 mg by mouth daily.   Yes [provider]  torsemide (DEMADEX) 20 MG tablet Take 40 mg by mouth daily.   Yes [provider]  Vitamin D, Ergocalciferol, (DRISDOL) 50000 units CAPS capsule Take 50,000 Units by mouth every 7 (seven) days.   Yes [provider]  amLODipine (NORVASC) 10 MG tablet Take by mouth. 08/22/15 08/21/16  [provider]  atorvastatin (LIPITOR) 40 MG tablet Take by mouth. 08/15/15 08/14/16  [provider]  lisinopril-hydrochlorothiazide (PRINZIDE,ZESTORETIC) 20-25 MG tablet Take by mouth. 08/15/15 08/14/16  [provider]   Allergies  Allergen Reactions  . Meperidine Nausea And Vomiting       . Oxycodone-Acetaminophen Nausea And Vomiting         VITAL SIGNS: Temp:  [98.6 F (37 C)-99 F (37.2 C)] 99 F (37.2 C) (12/22 0800) Pulse Rate:  [65-75] 69 (12/22 0930) Resp:  [0-23] 10 (12/22 0930) BP: (101-165)/(43-57) 125/47 (12/22 0930) SpO2:  [94 %-100  %] 98 % (12/22 0930) FiO2 (%):  [30 %] 30 % (12/22 0900) Weight:  [72.3 kg (159 lb 6.3 oz)] 72.3 kg (159 lb 6.3 oz) (12/22 0251)  Physical Examination:   - Off sedation, unresponsive, no corneal reflex, no gag reflex, no DTR, no dullness.  Triggering the ventilator and no spontaneous movements.  Detailed of the neurological exam as per neurology  - On vent, no distress, bilateral equal air entry and no adventitious sounds.  Minimal clear secretions.  The rest superficial dehiscence of median sternotomy.  - S1 and S2 are audible no murmur  - Benign abdominal exam was febrile peristalsis.  Tolerating tube feeding  - Extremities exam normal was no peripheral edema.          Recent Labs  Lab 05/30/17 2142 05/31/17 0119 05/31/17 0549  NA 136 133* 134*  K 4.2 4.0 3.9  CL 109 105 104  CO2 21* 21* 22  BUN 39* 42* 41*  CREATININE 3.71* 3.83* 3.85*  GLUCOSE 157* 195* 182*   Recent Labs  Lab 05/29/17 0416 05/30/17 0406 05/30/17 1316 05/30/17 2337 05/31/17 0647  HGB 8.2* 7.4* 7.0* 6.6* 7.7*  HCT 25.0* 23.5* 21.5* 20.0* 23.6*  WBC 10.4 12.4*  --   --  10.9  PLT 245 266  --   --  207   Ct Head Wo Contrast  Result Date: 05/29/2017 CLINICAL DATA:  Initial evaluation for acute altered mental status, unexplained. Status post cardiac arrest. EXAM: CT HEAD WITHOUT CONTRAST TECHNIQUE: Contiguous axial images were obtained from the base of the skull through the vertex without intravenous contrast. COMPARISON:  Prior CT from 05/28/2017. FINDINGS: Brain: Cerebral volume within normal limits. No acute intracranial hemorrhage. No evidence for acute large vessel territory infarct. Gray-white matter differentiation maintained without evidence for cerebral anoxia or cerebral edema. No mass lesion or midline shift. Ventricles stable in size without hydrocephalus. No extra-axial fluid collection. Vascular: No hyperdense vessel. Skull: Scalp soft tissues and calvarium within normal limits.  Sinuses/Orbits: Globes and orbital soft tissues normal. Patient status post lens extraction bilaterally. Minimal layering secretions noted within the left sphenoid sinus. Paranasal sinuses are otherwise clear. A sequelae of prior mastoidectomy noted on the left. Right mastoid air cells clear. Other: None. IMPRESSION: Stable normal head CT. No acute intracranial abnormality identified. Electronically Signed   By: Jeannine Boga M.D.   On: 05/29/2017 18:50   US Renal  Result Date: 05/30/2017 CLINICAL DATA:  Acute kidney failure superimposed on chronic kidney disease. EXAM: RENAL / URINARY TRACT ULTRASOUND COMPLETE COMPARISON:  CT, 12/10/2011 FINDINGS: Right Kidney: Length: 10.1 cm. Mild increase in parenchymal echogenicity. No mass or stone. No hydronephrosis. Left Kidney: Length: 10.1 cm. Echogenicity within normal limits. No mass or hydronephrosis visualized. Bladder: Not well distended.  Grossly unremarkable. IMPRESSION: 1. No acute findings.  No hydronephrosis.  No renal masses. 2. Mild increased right renal parenchymal echogenicity consistent with medical renal disease. No other abnormality. Electronically Signed   By: Dedra Skeens.D.  On: 05/30/2017 11:06   Dg Chest Port 1 View  Result Date: 05/30/2017 CLINICAL DATA:  Endotracheal tube present. EXAM: PORTABLE CHEST 1 VIEW COMPARISON:  Chest x-rays dated 05/29/2017 and 05/28/2017. FINDINGS: Endotracheal tube is well positioned with tip just above the level of the carina. Right IJ central line is well position with tip at the level of the lower SVC/ cavoatrial junction. Enteric tube passes below the diaphragm. Heart size and mediastinal contours are stable. Probable mild atelectasis at each lung base. Probable small left pleural effusion. Lungs otherwise clear. No pneumothorax seen. IMPRESSION: 1. Probable mild atelectasis at each lung base. Probable small left pleural effusion. No new lung findings. 2. Support apparatus appears appropriately  positioned. Electronically Signed   By: Franki Cabot M.D.   On: 05/30/2017 07:09    ASSESSMENT / PLAN:  -Acute respiratory failure.  Patient continues to have poor weaning parameters because of poor neurologic and exam status post cardiac arrest.  Continuous ventilator support.  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  No acute intracranial abnormalities and CT head.  Status post induced hypothermia.  Continue to monitor neuro status and follow with neurology and plan to obtain MRI if no improvement in neurological exam.  -Status post cardiac arrest.  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  Management as per cardiology no further invasive cardiac workup was advised.  -Bibasilar atelectasis.  Respiratory care, monitor chest x-ray, CBC, FiO2 requirement.  Patient has been is on cefepime we will consider broadening antimicrobial coverage if developed systemic inflammatory response syndrome.  - Acute renal failure on chronic kidney disease stage III with proteinuria: baseline creatinine of 1.5, GFR of 35 on 05/22/17.  Has been on a bicarbonate drip as per renal .   -C. difficile colitis.  On Flagyl and vancomycin p.o.  -UTI.  On cefepime, possible contaminated sample on urine culture.  -Anemia.  Monitor H&H and hemoglobin more than 7 g/dL.  -Superficial dehiscence of median sternotomy.  Wound care.  -Full code.    -DVT and GI prophylaxis.  Continue with supportive care.  Mr. Lyman Speller was updated at the bedside and he agreed with the plan of care.  Critical care time 40 minutes

## 2017-05-31 NOTE — Progress Notes (Signed)
Normothermic at 37 degrees completed for 48 hours.  Arctic sun pads removed at 1500.

## 2017-05-31 NOTE — Progress Notes (Signed)
Pharmacy Antibiotic Note  Kim Stevens is a 61 y.o. female admitted on 06/06/2017 admitted to ICU.  Pharmacy consulted for cefepime dosing. Patient is being treated for clostridium difficile with oral vancomycin and IV metronidazole. Patient underwent targeted temperature management, completed warming at 1500 on 12/20.   Plan: Continue Cefepime 1g IV Q24hr.   Height: 5\' 4"  (162.6 cm) Weight: 159 lb 6.3 oz (72.3 kg) IBW/kg (Calculated) : 54.7  Temp (24hrs), Avg:98.7 F (37.1 C), Min:98.6 F (37 C), Max:99 F (37.2 C)  Recent Labs  Lab 05/21/2017 2220 05/28/17 0307 05/28/17 0308  05/29/17 0416  05/30/17 0406  05/30/17 1316 05/30/17 2142 05/31/17 0119 05/31/17 0549 05/31/17 0647 05/31/17 0952  WBC 10.2 6.3  --   --  10.4  --  12.4*  --   --   --   --   --  10.9  --   CREATININE 2.24* 2.26*  --    < >  --    < >  --    < > 3.25* 3.71* 3.83* 3.85*  --  3.87*  LATICACIDVEN  --   --  0.7  --   --   --   --   --   --   --   --   --   --   --    < > = values in this interval not displayed.    Estimated Creatinine Clearance: 15.1 mL/min (A) (by C-G formula based on SCr of 3.87 mg/dL (H)).    Allergies  Allergen Reactions  . Meperidine Nausea And Vomiting       . Oxycodone-Acetaminophen Nausea And Vomiting         Antimicrobials this admission: Vancomycin PO 12/19 >>  Metronidazole 12/20  >>  Cefepime 12/20 >>  Dose adjustments this admission: N/A  Microbiology results: 12/19 BCx: no growth x 1 day  12/19 UCx: mbo 12/19 Sputum: reincubated for better growth  12/19 CDiff: positive  12/19 GI Panel: negative  12/19  MRSA PCR: negative   Thank you for allowing pharmacy to be a part of this patient's care.  Mohammad Granade D 05/31/2017 12:07 PM

## 2017-05-31 NOTE — Progress Notes (Addendum)
[facility] NAME: Kim Stevens    MR#:  [id number]  DATE OF BIRTH:  1955/11/09  SUBJECTIVE:  CHIEF COMPLAINT:   Chief Complaint  Patient presents with  . Cardiac Arrest   - cardiac arrest, recent CABG 4 weeks ago - Patient has been off of sedation for more than 24 hours, no improvement in her mental status. Not responding. -Tube feeds Started. Vitals are stable on vent  REVIEW OF SYSTEMS:  Review of Systems  Unable to perform ROS: Critical illness  Intubated and sedated  DRUG ALLERGIES:   Allergies  Allergen Reactions  . Meperidine Nausea And Vomiting       . Oxycodone-Acetaminophen Nausea And Vomiting         VITALS:  Blood pressure (!) 125/47, pulse 69, temperature 99 F (37.2 C), temperature source Bladder, resp. rate 10, height 5\' 4"  (1.626 m), weight 72.3 kg (159 lb 6.3 oz), SpO2 98 %.  PHYSICAL EXAMINATION:  Physical Exam  GENERAL:  61 y.o.-year-old patient lying in the bed, critically ill appearing.  EYES: Pupils pinpoint with no reaction to light. No scleral icterus.  HEENT: Head atraumatic, normocephalic. Oropharynx and nasopharynx clear.  NECK:  Supple, no jugular venous distention. No thyroid enlargement, no tenderness.  LUNGS: Normal breath sounds bilaterally, no wheezing, rales,rhonchi or crepitation. No use of accessory muscles of respiration. Decreased at the bases CARDIOVASCULAR: S1, S2 normal. No murmurs, rubs, or gallops.  CABG incision with gaping noted- dressing in place- no purulence noted. Superficial midline sternal incision dehiscence present. ABDOMEN: Soft, nontender, nondistended. Hypoactive Bowel sounds present. No organomegaly or mass. Cooling gel pads on the abdomen EXTREMITIES: No pedal edema, cyanosis, or clubbing.  NEUROLOGIC: not responding currently. No withdrawal to tactile stimulus or even pain. No posturing noted. PSYCHIATRIC: The patient is obtunded.  SKIN: No obvious rash,  lesion, or ulcer.    LABORATORY PANEL:   CBC Recent Labs  Lab 05/31/17 0647  WBC 10.9  HGB 7.7*  HCT 23.6*  PLT 207   ------------------------------------------------------------------------------------------------------------------  Chemistries  Recent Labs  Lab 05/30/17 1316  05/31/17 0549  NA 134*   < > 134*  K 4.1   < > 3.9  CL 111   < > 104  CO2 15*   < > 22  GLUCOSE 143*   < > 182*  BUN 34*   < > 41*  CREATININE 3.25*   < > 3.85*  CALCIUM 6.5*   < > 7.1*  MG  --   --  1.3*  AST 36  --   --   ALT 12*  --   --   ALKPHOS 73  --   --   BILITOT 0.4  --   --    < > = values in this interval not displayed.   ------------------------------------------------------------------------------------------------------------------  Cardiac Enzymes Recent Labs  Lab 05/28/17 1756  TROPONINI 0.04*   ------------------------------------------------------------------------------------------------------------------  RADIOLOGY:  Ct Head Wo Contrast  Result Date: 05/29/2017 CLINICAL DATA:  Initial evaluation for acute altered mental status, unexplained. Status post cardiac arrest. EXAM: CT HEAD WITHOUT CONTRAST TECHNIQUE: Contiguous axial images were obtained from the base of the skull through the vertex without intravenous contrast. COMPARISON:  Prior CT from 05/28/2017. FINDINGS: Brain: Cerebral volume within normal limits. No acute intracranial hemorrhage. No evidence for acute large vessel territory infarct. Gray-white matter differentiation maintained without evidence for cerebral anoxia or cerebral edema. No mass lesion or midline shift. Ventricles stable in  size without hydrocephalus. No extra-axial fluid collection. Vascular: No hyperdense vessel. Skull: Scalp soft tissues and calvarium within normal limits. Sinuses/Orbits: Globes and orbital soft tissues normal. Patient status post lens extraction bilaterally. Minimal layering secretions noted within the left sphenoid sinus.  Paranasal sinuses are otherwise clear. A sequelae of prior mastoidectomy noted on the left. Right mastoid air cells clear. Other: None. IMPRESSION: Stable normal head CT. No acute intracranial abnormality identified. Electronically Signed   By: Jeannine Boga M.D.   On: 05/29/2017 18:50   US Renal  Result Date: 05/30/2017 CLINICAL DATA:  Acute kidney failure superimposed on chronic kidney disease. EXAM: RENAL / URINARY TRACT ULTRASOUND COMPLETE COMPARISON:  CT, 12/10/2011 FINDINGS: Right Kidney: Length: 10.1 cm. Mild increase in parenchymal echogenicity. No mass or stone. No hydronephrosis. Left Kidney: Length: 10.1 cm. Echogenicity within normal limits. No mass or hydronephrosis visualized. Bladder: Not well distended.  Grossly unremarkable. IMPRESSION: 1. No acute findings.  No hydronephrosis.  No renal masses. 2. Mild increased right renal parenchymal echogenicity consistent with medical renal disease. No other abnormality. Electronically Signed   By: Lajean Manes M.D.   On: 05/30/2017 11:06   Dg Chest Port 1 View  Result Date: 05/30/2017 CLINICAL DATA:  Endotracheal tube present. EXAM: PORTABLE CHEST 1 VIEW COMPARISON:  Chest x-rays dated 05/29/2017 and 05/28/2017. FINDINGS: Endotracheal tube is well positioned with tip just above the level of the carina. Right IJ central line is well position with tip at the level of the lower SVC/ cavoatrial junction. Enteric tube passes below the diaphragm. Heart size and mediastinal contours are stable. Probable mild atelectasis at each lung base. Probable small left pleural effusion. Lungs otherwise clear. No pneumothorax seen. IMPRESSION: 1. Probable mild atelectasis at each lung base. Probable small left pleural effusion. No new lung findings. 2. Support apparatus appears appropriately positioned. Electronically Signed   By: Franki Cabot M.D.   On: 05/30/2017 07:09    EKG:   Orders placed or performed during the hospital encounter of 06/05/2017  .  EKG 12-Lead  . EKG 12-Lead  . EKG 12-Lead  . EKG 12-Lead  . EKG 12-Lead  . EKG 12-Lead    ASSESSMENT AND PLAN:   61 year old female with past medical history significant for idiopathic cardiomyopathy with EF of 45%, hypertension, DM and CKD who had CABG at [facility] 4 weeks ago was brought in from rehab due to cardiac arrest.  1. Cardiac arrest-  ? Sepsis vs respiratory cause likely - CT chest negative for PE - appreciate cardiology and intensivist consult - ECHO done, less likely to be cardiac per cardiology - hypothermia protocol finished and no improvement in mental status -Remains intubated  2. Sepsis- on low dose  levophed, elevated procalcitonin - stool for c.diff positive, no pneumonia noted- on empiric cefepime for atelectasis and UTI. Urine appears contaminated - on oral vancomycin and IV flagyl for C. difficile colitis  3. ARF- likely from [facility]- with cardiac arrest, sepsis - creatinine at discharge was 1.5  last week, now worsening and also oliguric - nephrology consult -on bicarbonate drip -No acute indication for dialysis. Making minimal amount of urine. Continue to monitor.  4. Anemia of chronic disease and also from recent surgery- hb at discharge was around 7.5, Steady drop noted in the last 24 hours. Received one unit transfusion for hemoglobin of 6.6 last night.  5. Acute encephalopathy-anoxic encephalopathy. -CT head without any acute findings yesterday.  -If no improvement within the next 48 hours, MRI and  EEG for prognostic indication. -Off sedation for 24 hours already. Appreciate neurology consult  6. DVT Prophylaxis- heparin SQ   Critically ill, overall poor prognosis.     All the records are reviewed and case discussed with Care Management/Social Workerr. Management plans discussed with the patient, family and they are in agreement.  CODE STATUS: Full Code  TOTAL TIME TAKING CARE OF THIS PATIENT: 38 minutes.   POSSIBLE D/C IN  2-3 DAYS, DEPENDING ON CLINICAL CONDITION.   Gladstone Lighter M.D on 05/31/2017 at 10:18 AM  Between 7am to 6pm - Pager - [phone]  After 6pm go to [email or web address] - password [email or web address] [facility]  Office  [phone]  CC: Primary care physician; Rudi Coco, MD (Inactive)

## 2017-06-01 ENCOUNTER — Other Ambulatory Visit: Payer: Self-pay

## 2017-06-01 ENCOUNTER — Inpatient Hospital Stay: Payer: BC Managed Care – PPO

## 2017-06-01 LAB — CBC WITH DIFFERENTIAL/PLATELET
BASOS ABS: 0 10*3/uL (ref 0–0.1)
BASOS PCT: 0 %
EOS ABS: 0.6 10*3/uL (ref 0–0.7)
EOS PCT: 6 %
HCT: 25.8 % — ABNORMAL LOW (ref 35.0–47.0)
Hemoglobin: 8.6 g/dL — ABNORMAL LOW (ref 12.0–16.0)
LYMPHS ABS: 0.7 10*3/uL — AB (ref 1.0–3.6)
Lymphocytes Relative: 7 %
MCH: 29.5 pg (ref 26.0–34.0)
MCHC: 33.5 g/dL (ref 32.0–36.0)
MCV: 88.1 fL (ref 80.0–100.0)
Monocytes Absolute: 0.6 10*3/uL (ref 0.2–0.9)
Monocytes Relative: 6 %
NEUTROS PCT: 81 %
Neutro Abs: 7.5 10*3/uL — ABNORMAL HIGH (ref 1.4–6.5)
PLATELETS: 217 10*3/uL (ref 150–440)
RBC: 2.93 MIL/uL — AB (ref 3.80–5.20)
RDW: 17.6 % — ABNORMAL HIGH (ref 11.5–14.5)
WBC: 9.4 10*3/uL (ref 3.6–11.0)

## 2017-06-01 LAB — GLUCOSE, CAPILLARY
GLUCOSE-CAPILLARY: 241 mg/dL — AB (ref 65–99)
GLUCOSE-CAPILLARY: 260 mg/dL — AB (ref 65–99)
Glucose-Capillary: 244 mg/dL — ABNORMAL HIGH (ref 65–99)
Glucose-Capillary: 263 mg/dL — ABNORMAL HIGH (ref 65–99)
Glucose-Capillary: 297 mg/dL — ABNORMAL HIGH (ref 65–99)
Glucose-Capillary: 305 mg/dL — ABNORMAL HIGH (ref 65–99)

## 2017-06-01 LAB — COMPREHENSIVE METABOLIC PANEL
ALK PHOS: 100 U/L (ref 38–126)
ALT: 13 U/L — AB (ref 14–54)
ANION GAP: 10 (ref 5–15)
AST: 54 U/L — ABNORMAL HIGH (ref 15–41)
Albumin: 1.8 g/dL — ABNORMAL LOW (ref 3.5–5.0)
BILIRUBIN TOTAL: 0.4 mg/dL (ref 0.3–1.2)
BUN: 51 mg/dL — ABNORMAL HIGH (ref 6–20)
CALCIUM: 7.2 mg/dL — AB (ref 8.9–10.3)
CO2: 26 mmol/L (ref 22–32)
CREATININE: 3.88 mg/dL — AB (ref 0.44–1.00)
Chloride: 100 mmol/L — ABNORMAL LOW (ref 101–111)
GFR, EST AFRICAN AMERICAN: 13 mL/min — AB (ref >60)
GFR, EST NON AFRICAN AMERICAN: 12 mL/min — AB (ref >60)
Glucose, Bld: 254 mg/dL — ABNORMAL HIGH (ref 65–99)
Potassium: 3.5 mmol/L (ref 3.5–5.1)
Sodium: 136 mmol/L (ref 135–145)
TOTAL PROTEIN: 5.2 g/dL — AB (ref 6.5–8.1)

## 2017-06-01 LAB — MAGNESIUM: MAGNESIUM: 1.2 mg/dL — AB (ref 1.7–2.4)

## 2017-06-01 LAB — BLOOD GAS, ARTERIAL
Acid-Base Excess: 5.6 mmol/L — ABNORMAL HIGH (ref 0.0–2.0)
BICARBONATE: 29.8 mmol/L — AB (ref 20.0–28.0)
FIO2: 0.3
O2 Saturation: 97.3 %
PEEP/CPAP: 5 cmH2O
Patient temperature: 37
RATE: 14 resp/min
VT: 400 mL
pCO2 arterial: 41 mmHg (ref 32.0–48.0)
pH, Arterial: 7.47 — ABNORMAL HIGH (ref 7.350–7.450)
pO2, Arterial: 88 mmHg (ref 83.0–108.0)

## 2017-06-01 LAB — LACTIC ACID, PLASMA
LACTIC ACID, VENOUS: 1.6 mmol/L (ref 0.5–1.9)
LACTIC ACID, VENOUS: 1.3 mmol/L (ref 0.5–1.9)

## 2017-06-01 LAB — TYPE AND SCREEN
ABO/RH(D): A NEG
Antibody Screen: NEGATIVE
UNIT DIVISION: 0

## 2017-06-01 LAB — URINE CULTURE: Culture: 60000 — AB

## 2017-06-01 LAB — PHOSPHORUS: PHOSPHORUS: 4.6 mg/dL (ref 2.5–4.6)

## 2017-06-01 LAB — BPAM RBC
Blood Product Expiration Date: 201812292359
ISSUE DATE / TIME: 201812220308
Unit Type and Rh: 600

## 2017-06-01 MED ORDER — MAGNESIUM SULFATE 2 GM/50ML IV SOLN
2.0000 g | Freq: Once | INTRAVENOUS | Status: AC
  Administered 2017-06-01: 2 g via INTRAVENOUS
  Filled 2017-06-01: qty 50

## 2017-06-01 NOTE — Progress Notes (Addendum)
[facility] [place] NAME: Kim Stevens    MR#:  [id number]  DATE OF BIRTH:  1955/08/06  SUBJECTIVE:  CHIEF COMPLAINT:   Chief Complaint  Patient presents with  . Cardiac Arrest   - cardiac arrest, recent CABG 4 weeks ago - Off sedation for more than 48 hours with no improvement in her mental status. Patient is not responding. Vitals are stable on vent. Receiving tube feedings  REVIEW OF SYSTEMS:  Review of Systems  Unable to perform ROS: Critical illness  Intubated and sedated  DRUG ALLERGIES:   Allergies  Allergen Reactions  . Meperidine Nausea And Vomiting       . Oxycodone-Acetaminophen Nausea And Vomiting         VITALS:  Blood pressure (!) 146/51, pulse 74, temperature 97.7 F (36.5 C), resp. rate 15, height 5\' 4"  (1.626 m), weight 74.2 kg (163 lb 9.3 oz), SpO2 99 %.  PHYSICAL EXAMINATION:  Physical Exam  GENERAL:  60 y.o.-year-old patient lying in the bed, critically ill appearing.  EYES: Pupils pinpoint with no reaction to light. No scleral icterus.  HEENT: Head atraumatic, normocephalic. Oropharynx and nasopharynx clear.  NECK:  Supple, no jugular venous distention. No thyroid enlargement, no tenderness.  LUNGS: Normal breath sounds bilaterally, no wheezing, rales,rhonchi or crepitation. No use of accessory muscles of respiration. Decreased at the bases CARDIOVASCULAR: S1, S2 normal. No murmurs, rubs, or gallops.  CABG incision with gaping noted- dressing in place- no purulence noted. Superficial midline sternal incision dehiscence present. ABDOMEN: Soft, nontender, nondistended. Hypoactive Bowel sounds present. No organomegaly or mass. Cooling gel pads on the abdomen EXTREMITIES: No pedal edema, cyanosis, or clubbing.  NEUROLOGIC: not responding currently. No withdrawal to tactile stimulus or even pain. No posturing noted. PSYCHIATRIC: The patient is obtunded.  SKIN: No obvious rash, lesion, or ulcer.     LABORATORY PANEL:   CBC Recent Labs  Lab 06/01/17 0438  WBC 9.4  HGB 8.6*  HCT 25.8*  PLT 217   ------------------------------------------------------------------------------------------------------------------  Chemistries  Recent Labs  Lab 06/01/17 0438  NA 136  K 3.5  CL 100*  CO2 26  GLUCOSE 254*  BUN 51*  CREATININE 3.88*  CALCIUM 7.2*  MG 1.2*  AST 54*  ALT 13*  ALKPHOS 100  BILITOT 0.4   ------------------------------------------------------------------------------------------------------------------  Cardiac Enzymes Recent Labs  Lab 05/28/17 1756  TROPONINI 0.04*   ------------------------------------------------------------------------------------------------------------------  RADIOLOGY:  US Renal  Result Date: 05/30/2017 CLINICAL DATA:  Acute kidney failure superimposed on chronic kidney disease. EXAM: RENAL / URINARY TRACT ULTRASOUND COMPLETE COMPARISON:  CT, 12/10/2011 FINDINGS: Right Kidney: Length: 10.1 cm. Mild increase in parenchymal echogenicity. No mass or stone. No hydronephrosis. Left Kidney: Length: 10.1 cm. Echogenicity within normal limits. No mass or hydronephrosis visualized. Bladder: Not well distended.  Grossly unremarkable. IMPRESSION: 1. No acute findings.  No hydronephrosis.  No renal masses. 2. Mild increased right renal parenchymal echogenicity consistent with medical renal disease. No other abnormality. Electronically Signed   By: Lajean Manes M.D.   On: 05/30/2017 11:06   Dg Chest Port 1 View  Result Date: 06/01/2017 CLINICAL DATA:  Evaluate for atelectasis EXAM: PORTABLE CHEST 1 VIEW COMPARISON:  Two days ago FINDINGS: Endotracheal tube tip just below the clavicular heads. An orogastric tube reaches the stomach. Right IJ line with tip at the right atrium. Haziness of the lower chest that is progressed, now with non visible right diaphragm. No visible Kerley lines or air bronchogram. No  pneumothorax. Stable heart size.   Status post CABG. IMPRESSION: 1. Stable positioning of tubes and central line. 2. Worsening aeration at the bases, hazy appearance favoring atelectasis and pleural fluid. Electronically Signed   By: Monte Fantasia M.D.   On: 06/01/2017 07:08    EKG:   Orders placed or performed during the hospital encounter of 06/03/2017  . EKG 12-Lead  . EKG 12-Lead  . EKG 12-Lead  . EKG 12-Lead  . EKG 12-Lead  . EKG 12-Lead    ASSESSMENT AND PLAN:   61 year old female with past medical history significant for idiopathic cardiomyopathy with EF of 45%, hypertension, DM and CKD who had CABG at [facility] 4 weeks ago was brought in from rehab due to cardiac arrest.  1. Cardiac arrest-  ? Sepsis vs respiratory cause likely - CT chest negative for PE - appreciate cardiology and intensivist consult - ECHO done, less likely to be cardiac per cardiology - hypothermia protocol finished > 48hrs and no improvement in mental status -Remains intubated  2. Sepsis- off  levophed, elevated procalcitonin - stool for c.diff positive, no pneumonia noted- on empiric cefepime for atelectasis and UTI. Urine appears contaminated - on oral vancomycin and IV flagyl for C. difficile colitis  3. ARF- likely from [facility]- with cardiac arrest, sepsis - creatinine at discharge was 1.5  last week, now worsening and also oliguric - nephrology consult -on bicarbonate drip -No acute indication for dialysis. Making minimal amount of urine. Continue to monitor.  4. Anemia of chronic disease and also from recent surgery- hb at discharge was around 7.5, Steady drop noted after admission, Received one unit transfusion for hemoglobin of 6.6  5. Acute encephalopathy-anoxic encephalopathy. -CT head without any acute findings.  -If no improvement within the next 24 hours, MRI and EEG for prognostic indication. -Off sedation for 48 hours already. Appreciate neurology consult  6. DVT Prophylaxis- heparin SQ  7.  Hypomagnesemia-recommend replacement   Critically ill, overall poor prognosis.     All the records are reviewed and case discussed with Care Management/Social Workerr. Management plans discussed with the patient, family and they are in agreement.  CODE STATUS: Full Code  TOTAL TIME TAKING CARE OF THIS PATIENT: 38 minutes.   POSSIBLE D/C IN 2-3 DAYS, DEPENDING ON CLINICAL CONDITION.   Gladstone Lighter M.D on 06/01/2017 at 8:06 AM  Between 7am to 6pm - Pager - [phone]  After 6pm go to [email or web address] - password [email or web address] [facility]  Office  [phone]  CC: Primary care physician; Rudi Coco, MD (Inactive)

## 2017-06-01 NOTE — Progress Notes (Signed)
Name: Kim Stevens MRN: [id number] DOB: May 08, 1956     CONSULTATION DATE: 05/16/2017 Subjective: No major events last night.  Objective: Patient remains on a ventilator, no improvement in neuro exam, tolerating tube feed and awaiting MRI in the morning for neurological reevaluation.   PAST MEDICAL HISTORY :   has a past medical history of Anxiety state (01/22/2011), Combined forms of age-related cataract of both eyes (03/30/2014), Diabetes mellitus without complication ([place]), HTN (hypertension) (08/30/2013), Hypertension, Irritable bowel syndrome (01/22/2011), Microalbuminuria (08/15/2015), Polyneuropathy associated with underlying disease ([place]) (08/15/2015), and Type 2 diabetes mellitus not at goal [facility]) (08/15/2015).  has a past surgical history that includes Cholecystectomy; Eye surgery; Colonoscopy with propofol (N/A, 07/15/2016); and polypectomy (07/15/2016). Prior to Admission medications   Medication Sig Start Date End Date Taking? Authorizing Provider  acetaminophen (TYLENOL) 325 MG tablet Take 650 mg by mouth every 6 (six) hours as needed for mild pain.   Yes [provider]  albuterol (PROVENTIL) (2.5 MG/3ML) 0.083% nebulizer solution Take 2.5 mg by nebulization 2 (two) times daily as needed for wheezing or shortness of breath.   Yes [provider]  antiseptic oral rinse (BIOTENE) LIQD 15 mLs by Mouth Rinse route every 2 (two) hours as needed for dry mouth.   Yes [provider]  atorvastatin (LIPITOR) 20 MG tablet Take 20 mg by mouth daily at 6 PM.   Yes [provider]  bisacodyl (DULCOLAX) 10 MG suppository Place 10 mg rectally daily as needed for moderate constipation.   Yes [provider]  cholecalciferol (VITAMIN D) 1000 units tablet Take 1,000 Units by mouth daily.   Yes [provider]  Cold Sore Products (BLISTEX EX) Apply 1 application topically every 2 (two) hours as needed (dry lips).   Yes [provider]    gabapentin (NEURONTIN) 300 MG capsule Take 300 mg by mouth every 6 (six) hours as needed (neuropathy).   Yes [provider]  Glucagon, rDNA, (GLUCAGON EMERGENCY IJ) Inject 0.25 mLs as directed as needed (low blood sugar).   Yes [provider]  heparin 10000 UNIT/ML injection Inject 5,000 Units into the skin every 8 (eight) hours.   Yes [provider]  insulin glargine (LANTUS) 100 UNIT/ML injection Inject 14 Units into the skin at bedtime.    Yes [provider]  insulin lispro (HUMALOG) 100 UNIT/ML injection Inject 0-10 Units into the skin 4 (four) times daily. 0-150= 0 units 151-200= 2 units 201-250= 4 units 251-300= 6 units 301-350= 8 units Greater than 350= 10 units. Recheck in 2 hours. Call MD   Yes [provider]  Lactobacillus Rhamnosus, GG, (CULTURELLE) CAPS Take 1 capsule by mouth daily.   Yes [provider]  levothyroxine (SYNTHROID, LEVOTHROID) 25 MCG tablet Take 25 mcg by mouth daily before breakfast.   Yes [provider]  LORazepam (ATIVAN) 0.5 MG tablet Take 0.5 mg by mouth every 12 (twelve) hours as needed for anxiety.   Yes [provider]  Melatonin 3 MG TABS Take 3 mg by mouth at bedtime.   Yes [provider]  polyethylene glycol (MIRALAX / GLYCOLAX) packet Take 17 g by mouth daily as needed for mild constipation.   Yes [provider]  sildenafil (REVATIO) 20 MG tablet Take 20 mg by mouth 3 (three) times daily.   Yes [provider]  sitaGLIPtin (JANUVIA) 50 MG tablet Take 50 mg by mouth daily.   Yes [provider]  spironolactone (ALDACTONE) 25 MG tablet Take  25 mg by mouth daily.   Yes [provider]  torsemide (DEMADEX) 20 MG tablet Take 40 mg by mouth daily.   Yes [provider]  Vitamin D, Ergocalciferol, (DRISDOL) 50000 units CAPS capsule Take 50,000 Units by mouth every 7 (seven) days.   Yes [provider]  amLODipine  (NORVASC) 10 MG tablet Take by mouth. 08/22/15 08/21/16  [provider]  atorvastatin (LIPITOR) 40 MG tablet Take by mouth. 08/15/15 08/14/16  [provider]  lisinopril-hydrochlorothiazide (PRINZIDE,ZESTORETIC) 20-25 MG tablet Take by mouth. 08/15/15 08/14/16  [provider]   Allergies  Allergen Reactions  . Meperidine Nausea And Vomiting       . Oxycodone-Acetaminophen Nausea And Vomiting             VITAL SIGNS: Temp:  [96.8 F (36 C)-98.2 F (36.8 C)] 97.3 F (36.3 C) (12/23 0900) Pulse Rate:  [61-83] 72 (12/23 0900) Resp:  [12-30] 15 (12/23 0900) BP: (126-176)/(49-101) 156/56 (12/23 0900) SpO2:  [95 %-100 %] 98 % (12/23 0900) FiO2 (%):  [30 %] 30 % (12/23 0700) Weight:  [74.2 kg (163 lb 9.3 oz)] 74.2 kg (163 lb 9.3 oz) (12/23 0500)  Physical Examination:   - Off sedation, unresponsive, no corneal reflex, no gag reflex, no DTR, no dullness.She is not triggering the ventilator and no spontaneous movements.Detailedof the neurological exam as per neurology  - On vent, no distress, bilateral equal air entry and no adventitious sounds.Minimal clear secretions.The restsuperficial dehiscence of median sternotomy.  - S1 and S2 are audible no murmur  - Benign abdominal exam was febrile peristalsis. Tolerating tube feeding  - Extremities within normal was no peripheral edema.           Recent Labs  Lab 05/31/17 1440 05/31/17 1804 06/01/17 0438  NA 133* 134* 136  K 3.6 3.7 3.5  CL 101 101 100*  CO2 26 24 26   BUN 46* 48* 51*  CREATININE 4.03* 3.92* 3.88*  GLUCOSE 221* 251* 254*   Recent Labs  Lab 05/30/17 0406  05/30/17 2337 05/31/17 0647 06/01/17 0438  HGB 7.4*   < > 6.6* 7.7* 8.6*  HCT 23.5*   < > 20.0* 23.6* 25.8*  WBC 12.4*  --   --  10.9 9.4  PLT 266  --   --  207 217   < > = values in this interval not displayed.   Dg Chest Port 1 View  Result Date: 06/01/2017 CLINICAL DATA:  Evaluate for atelectasis EXAM: PORTABLE  CHEST 1 VIEW COMPARISON:  Two days ago FINDINGS: Endotracheal tube tip just below the clavicular heads. An orogastric tube reaches the stomach. Right IJ line with tip at the right atrium. Haziness of the lower chest that is progressed, now with non visible right diaphragm. No visible Kerley lines or air bronchogram. No pneumothorax. Stable heart size.  Status post CABG. IMPRESSION: 1. Stable positioning of tubes and central line. 2. Worsening aeration at the bases, hazy appearance favoring atelectasis and pleural fluid. Electronically Signed   By: Monte Fantasia M.D.   On: 06/01/2017 07:08    ASSESSMENT / PLAN: -Acute respiratory failure.Patient continues to have poor weaning parameters because of poor neurologic and exam status post cardiac arrest. Continuous ventilator support.  -Altered mental status most possible anoxic encephalopathy status post cardiac arrest (worsening and she is not able to trigger the ventilator today).No acute intracranial abnormalities and CT head.Status post induced hypothermia.Continue to monitor neuro status and follow with neurology and plan to obtain  MRI in a.m. as per neurology.  -Status post cardiac arrest.No ischemic changes on EKG patient has history of coronary artery disease a status post CABG November 2018 was previous cardiac arrest.Management as per cardiology no further invasive cardiac workup was advised.  -Bibasilar atelectasis.  Respiratory care, monitor chest x-ray, CBC, FiO2 requirement.  Patient has been is on cefepime we will consider broadening antimicrobial coverage if developed systemic inflammatory response syndrome.  -Acute renal failure on chronic kidney disease stage III with proteinuria: baseline creatinine of 1.5, GFR of 35 on 05/22/17. Has been on a bicarbonate drip as per renal .   -C. difficile colitis.On Flagyl and vancomycin p.o.  -UTI.On cefepime,possible contaminated sample on urine  culture.  -Anemia.Monitor H&H andhemoglobin more than 7 g/dL.  -Superficial dehiscence of median sternotomy.  Wound care.  -Full code.  -DVT and GI prophylaxis. Continue with supportive care.  Critical care time 30 minutes

## 2017-06-01 NOTE — Progress Notes (Signed)
Subjective: 61 y.o. female who presented with PEA arrest unknown down time as last seen 2 hrs prior.  EMS arrived and 3 rounds of CPR and epinephrine were administered and ROSCwas achieved.  Pt has been warmed and off sedation for the past 2 days. Not following commands.     Objective: Current vital signs: BP (!) 156/56   Pulse 72   Temp (!) 97.3 F (36.3 C)   Resp 15   Ht 5' 4" (1.626 m)   Wt 163 lb 9.3 oz (74.2 kg)   SpO2 98%   BMI 28.08 kg/m  Vital signs in last 24 hours: Temp:  [96.8 F (36 C)-98.2 F (36.8 C)] 97.3 F (36.3 C) (12/23 0900) Pulse Rate:  [61-78] 72 (12/23 0900) Resp:  [12-30] 15 (12/23 0900) BP: (126-156)/(49-56) 156/56 (12/23 0900) SpO2:  [95 %-100 %] 98 % (12/23 0900) FiO2 (%):  [30 %] 30 % (12/23 1300) Weight:  [163 lb 9.3 oz (74.2 kg)] 163 lb 9.3 oz (74.2 kg) (12/23 0500)  Intake/Output from previous day: 12/22 0701 - 12/23 0700 In: 3410 [I.V.:2300; NG/GT:610; IV Piggyback:500] Out: 970 [Urine:970] Intake/Output this shift: No intake/output data recorded. Nutritional status: Diet NPO time specified  Neurologic Exam: Does not respond to painful stimuli No pupils that are 2-3 mm and non responsive No cough/gag No pupil responsiveness Pt does occasionally overbreathe the  Ventilator No response to painful stimuli.    Lab Results: Results for orders placed or performed during the hospital encounter of 06/08/2017 (from the past 48 hour(s))  Glucose, capillary     Status: Abnormal   Collection Time: 05/30/17  4:36 PM  Result Value Ref Range   Glucose-Capillary 153 (H) 65 - 99 mg/dL  Glucose, capillary     Status: Abnormal   Collection Time: 05/30/17  7:18 PM  Result Value Ref Range   Glucose-Capillary 162 (H) 65 - 99 mg/dL  Basic metabolic panel     Status: Abnormal   Collection Time: 05/30/17  9:42 PM  Result Value Ref Range   Sodium 136 135 - 145 mmol/L   Potassium 4.2 3.5 - 5.1 mmol/L   Chloride 109 101 - 111 mmol/L   CO2 21 (L) 22 -  32 mmol/L   Glucose, Bld 157 (H) 65 - 99 mg/dL   BUN 39 (H) 6 - 20 mg/dL   Creatinine, Ser 3.71 (H) 0.44 - 1.00 mg/dL   Calcium 7.4 (L) 8.9 - 10.3 mg/dL   GFR calc non Af Amer 12 (L) >60 mL/min   GFR calc Af Amer 14 (L) >60 mL/min    Comment: (NOTE) The eGFR has been calculated using the CKD EPI equation. This calculation has not been validated in all clinical situations. eGFR's persistently <60 mL/min signify possible Chronic Kidney Disease.    Anion gap 6 5 - 15    Comment: Performed at [facility], [address] [place], [place] [address]  Glucose, capillary     Status: Abnormal   Collection Time: 05/30/17 11:33 PM  Result Value Ref Range   Glucose-Capillary 183 (H) 65 - 99 mg/dL  Hemoglobin and hematocrit, blood     Status: Abnormal   Collection Time: 05/30/17 11:37 PM  Result Value Ref Range   Hemoglobin 6.6 (L) 12.0 - 16.0 g/dL   HCT 20.0 (L) 35.0 - 47.0 %    Comment: Performed at [facility], [address], [place], [place] [address]  ABO/Rh     Status: None   Collection Time:  05/30/17 11:37 PM  Result Value Ref Range   ABO/RH(D)      A NEG Performed at [facility], [address] [place], [place] [address]   Occult blood card to lab, stool     Status: None   Collection Time: 05/31/17 12:07 AM  Result Value Ref Range   Fecal Occult Bld NEGATIVE NEGATIVE    Comment: Performed at [facility], [address] [place], [place] [address]  Prepare RBC     Status: None   Collection Time: 05/31/17  1:11 AM  Result Value Ref Range   Order Confirmation      ORDER PROCESSED BY BLOOD BANK Performed at [facility], [address] [place], [place] [address]   Basic metabolic panel     Status: Abnormal   Collection Time: 05/31/17  1:19 AM  Result Value Ref Range   Sodium 133 (L) 135 - 145 mmol/L   Potassium 4.0 3.5 - 5.1 mmol/L   Chloride 105 101 - 111 mmol/L   CO2 21 (L) 22 - 32 mmol/L   Glucose, Bld 195 (H) 65 -  99 mg/dL   BUN 42 (H) 6 - 20 mg/dL   Creatinine, Ser 3.83 (H) 0.44 - 1.00 mg/dL   Calcium 7.1 (L) 8.9 - 10.3 mg/dL   GFR calc non Af Amer 12 (L) >60 mL/min   GFR calc Af Amer 14 (L) >60 mL/min    Comment: (NOTE) The eGFR has been calculated using the CKD EPI equation. This calculation has not been validated in all clinical situations. eGFR's persistently <60 mL/min signify possible Chronic Kidney Disease.    Anion gap 7 5 - 15    Comment: Performed at [facility], [address] [place], [place] [address]  Protime-INR     Status: Abnormal   Collection Time: 05/31/17  1:19 AM  Result Value Ref Range   Prothrombin Time 15.8 (H) 11.4 - 15.2 seconds   INR 1.27     Comment: Performed at [facility], [address] [place], [place] [address]  APTT     Status: Abnormal   Collection Time: 05/31/17  1:19 AM  Result Value Ref Range   aPTT 53 (H) 24 - 36 seconds    Comment:        IF BASELINE aPTT IS ELEVATED, SUGGEST PATIENT RISK ASSESSMENT BE USED TO DETERMINE APPROPRIATE ANTICOAGULANT THERAPY. Performed at [facility], [address] [place], [place] [address]   Type and screen     Status: None   Collection Time: 05/31/17  1:45 AM  Result Value Ref Range   ABO/RH(D) A NEG    Antibody Screen NEG    Sample Expiration 06/03/2017    Unit Number [id number]    Blood Component Type RBC, LR IRR    Unit division 00    Status of Unit ISSUED,FINAL    Transfusion Status OK TO TRANSFUSE    Crossmatch Result      Compatible Performed at [facility], [address] [place], [place] [address]   Glucose, capillary     Status: Abnormal   Collection Time: 05/31/17  3:55 AM  Result Value Ref Range   Glucose-Capillary 162 (H) 65 - 99 mg/dL  Renal function panel     Status: Abnormal   Collection Time: 05/31/17  5:49 AM  Result Value Ref Range   Sodium 134 (L) 135 - 145 mmol/L   Potassium 3.9 3.5 - 5.1 mmol/L   Chloride  104 101 - 111 mmol/L    CO2 22 22 - 32 mmol/L   Glucose, Bld 182 (H) 65 - 99 mg/dL   BUN 41 (H) 6 - 20 mg/dL   Creatinine, Ser 3.85 (H) 0.44 - 1.00 mg/dL   Calcium 7.1 (L) 8.9 - 10.3 mg/dL   Phosphorus 4.4 2.5 - 4.6 mg/dL   Albumin 1.8 (L) 3.5 - 5.0 g/dL   GFR calc non Af Amer 12 (L) >60 mL/min   GFR calc Af Amer 14 (L) >60 mL/min    Comment: (NOTE) The eGFR has been calculated using the CKD EPI equation. This calculation has not been validated in all clinical situations. eGFR's persistently <60 mL/min signify possible Chronic Kidney Disease.    Anion gap 8 5 - 15    Comment: Performed at [facility], [address] [place], [place] [address]  Magnesium     Status: Abnormal   Collection Time: 05/31/17  5:49 AM  Result Value Ref Range   Magnesium 1.3 (L) 1.7 - 2.4 mg/dL    Comment: Performed at [facility], [address] [place], [place] [address]  Basic metabolic panel     Status: Abnormal   Collection Time: 05/31/17  5:49 AM  Result Value Ref Range   Sodium 134 (L) 135 - 145 mmol/L   Potassium 4.0 3.5 - 5.1 mmol/L   Chloride 105 101 - 111 mmol/L   CO2 21 (L) 22 - 32 mmol/L   Glucose, Bld 178 (H) 65 - 99 mg/dL   BUN 42 (H) 6 - 20 mg/dL   Creatinine, Ser 3.89 (H) 0.44 - 1.00 mg/dL   Calcium 7.1 (L) 8.9 - 10.3 mg/dL   GFR calc non Af Amer 12 (L) >60 mL/min   GFR calc Af Amer 13 (L) >60 mL/min    Comment: (NOTE) The eGFR has been calculated using the CKD EPI equation. This calculation has not been validated in all clinical situations. eGFR's persistently <60 mL/min signify possible Chronic Kidney Disease.    Anion gap 8 5 - 15    Comment: Performed at [facility], [address] [place], [place] [address]  CBC     Status: Abnormal   Collection Time: 05/31/17  6:47 AM  Result Value Ref Range   WBC 10.9 3.6 - 11.0 K/uL   RBC 2.67 (L) 3.80 - 5.20 MIL/uL   Hemoglobin 7.7 (L) 12.0 - 16.0 g/dL   HCT 23.6 (L) 35.0 - 47.0 %   MCV 88.6 80.0 - 100.0 fL   MCH 28.8  26.0 - 34.0 pg   MCHC 32.6 32.0 - 36.0 g/dL   RDW 17.0 (H) 11.5 - 14.5 %   Platelets 207 150 - 440 K/uL    Comment: Performed at [facility], [address] [place], [place] [address]  Glucose, capillary     Status: Abnormal   Collection Time: 05/31/17  7:10 AM  Result Value Ref Range   Glucose-Capillary 171 (H) 65 - 99 mg/dL   Comment 1 Document in Chart   Basic metabolic panel     Status: Abnormal   Collection Time: 05/31/17  9:52 AM  Result Value Ref Range   Sodium 133 (L) 135 - 145 mmol/L   Potassium 3.8 3.5 - 5.1 mmol/L   Chloride 103 101 - 111 mmol/L   CO2 23 22 - 32 mmol/L   Glucose, Bld 184 (H) 65 - 99 mg/dL   BUN 41 (H) 6 - 20 mg/dL  Creatinine, Ser 3.87 (H) 0.44 - 1.00 mg/dL   Calcium 7.1 (L) 8.9 - 10.3 mg/dL   GFR calc non Af Amer 12 (L) >60 mL/min   GFR calc Af Amer 14 (L) >60 mL/min    Comment: (NOTE) The eGFR has been calculated using the CKD EPI equation. This calculation has not been validated in all clinical situations. eGFR's persistently <60 mL/min signify possible Chronic Kidney Disease.    Anion gap 7 5 - 15    Comment: Performed at [facility], [address] [place], [place] [address]  Urine Culture     Status: Abnormal   Collection Time: 05/31/17 10:50 AM  Result Value Ref Range   Specimen Description      URINE, RANDOM Performed at [facility], [address] [place], [place] [address]    Special Requests      NONE Performed at [facility], [address] [place], [place] [address]    Culture 60,000 COLONIES/mL YEAST (A)    Report Status 06/01/2017 FINAL   Glucose, capillary     Status: Abnormal   Collection Time: 05/31/17 11:49 AM  Result Value Ref Range   Glucose-Capillary 226 (H) 65 - 99 mg/dL   Comment 1 Document in Chart   Basic metabolic panel     Status: Abnormal   Collection Time: 05/31/17  2:40 PM  Result Value Ref Range   Sodium 133 (L) 135 - 145 mmol/L   Potassium 3.6 3.5 - 5.1  mmol/L   Chloride 101 101 - 111 mmol/L   CO2 26 22 - 32 mmol/L   Glucose, Bld 221 (H) 65 - 99 mg/dL   BUN 46 (H) 6 - 20 mg/dL   Creatinine, Ser 4.03 (H) 0.44 - 1.00 mg/dL   Calcium 6.8 (L) 8.9 - 10.3 mg/dL   GFR calc non Af Amer 11 (L) >60 mL/min   GFR calc Af Amer 13 (L) >60 mL/min    Comment: (NOTE) The eGFR has been calculated using the CKD EPI equation. This calculation has not been validated in all clinical situations. eGFR's persistently <60 mL/min signify possible Chronic Kidney Disease.    Anion gap 6 5 - 15    Comment: Performed at [facility], [address] [place], [place] [address]  Glucose, capillary     Status: Abnormal   Collection Time: 05/31/17  3:56 PM  Result Value Ref Range   Glucose-Capillary 231 (H) 65 - 99 mg/dL   Comment 1 Document in Chart   Basic metabolic panel     Status: Abnormal   Collection Time: 05/31/17  6:04 PM  Result Value Ref Range   Sodium 134 (L) 135 - 145 mmol/L   Potassium 3.7 3.5 - 5.1 mmol/L   Chloride 101 101 - 111 mmol/L   CO2 24 22 - 32 mmol/L   Glucose, Bld 251 (H) 65 - 99 mg/dL   BUN 48 (H) 6 - 20 mg/dL   Creatinine, Ser 3.92 (H) 0.44 - 1.00 mg/dL   Calcium 6.8 (L) 8.9 - 10.3 mg/dL   GFR calc non Af Amer 12 (L) >60 mL/min   GFR calc Af Amer 13 (L) >60 mL/min    Comment: (NOTE) The eGFR has been calculated using the CKD EPI equation. This calculation has not been validated in all clinical situations. eGFR's persistently <60 mL/min signify possible Chronic Kidney Disease.    Anion gap 9 5 - 15    Comment: Performed at [facility], [address] [place], [place] [address]  Glucose, capillary     Status: Abnormal   Collection Time: 05/31/17  7:44 PM  Result Value Ref Range   Glucose-Capillary 248 (H) 65 - 99 mg/dL  Glucose, capillary     Status: Abnormal   Collection Time: 06/01/17 12:03 AM  Result Value Ref Range   Glucose-Capillary 241 (H) 65 - 99 mg/dL   Comment 1 Notify RN   Glucose,  capillary     Status: Abnormal   Collection Time: 06/01/17  4:16 AM  Result Value Ref Range   Glucose-Capillary 260 (H) 65 - 99 mg/dL   Comment 1 Notify RN   Comprehensive metabolic panel     Status: Abnormal   Collection Time: 06/01/17  4:38 AM  Result Value Ref Range   Sodium 136 135 - 145 mmol/L   Potassium 3.5 3.5 - 5.1 mmol/L   Chloride 100 (L) 101 - 111 mmol/L   CO2 26 22 - 32 mmol/L   Glucose, Bld 254 (H) 65 - 99 mg/dL   BUN 51 (H) 6 - 20 mg/dL   Creatinine, Ser 3.88 (H) 0.44 - 1.00 mg/dL   Calcium 7.2 (L) 8.9 - 10.3 mg/dL   Total Protein 5.2 (L) 6.5 - 8.1 g/dL   Albumin 1.8 (L) 3.5 - 5.0 g/dL   AST 54 (H) 15 - 41 U/L   ALT 13 (L) 14 - 54 U/L   Alkaline Phosphatase 100 38 - 126 U/L   Total Bilirubin 0.4 0.3 - 1.2 mg/dL   GFR calc non Af Amer 12 (L) >60 mL/min   GFR calc Af Amer 13 (L) >60 mL/min    Comment: (NOTE) The eGFR has been calculated using the CKD EPI equation. This calculation has not been validated in all clinical situations. eGFR's persistently <60 mL/min signify possible Chronic Kidney Disease.    Anion gap 10 5 - 15    Comment: Performed at [facility], [address] [place], [place] [address]  Lactic acid, plasma     Status: None   Collection Time: 06/01/17  4:38 AM  Result Value Ref Range   Lactic Acid, Venous 1.6 0.5 - 1.9 mmol/L    Comment: Performed at [facility], [address] [place], [place] [address]  CBC with Differential/Platelet     Status: Abnormal   Collection Time: 06/01/17  4:38 AM  Result Value Ref Range   WBC 9.4 3.6 - 11.0 K/uL   RBC 2.93 (L) 3.80 - 5.20 MIL/uL   Hemoglobin 8.6 (L) 12.0 - 16.0 g/dL   HCT 25.8 (L) 35.0 - 47.0 %   MCV 88.1 80.0 - 100.0 fL   MCH 29.5 26.0 - 34.0 pg   MCHC 33.5 32.0 - 36.0 g/dL   RDW 17.6 (H) 11.5 - 14.5 %   Platelets 217 150 - 440 K/uL   Neutrophils Relative % 81 %   Neutro Abs 7.5 (H) 1.4 - 6.5 K/uL   Lymphocytes Relative 7 %   Lymphs Abs 0.7 (L) 1.0 - 3.6 K/uL    Monocytes Relative 6 %   Monocytes Absolute 0.6 0.2 - 0.9 K/uL   Eosinophils Relative 6 %   Eosinophils Absolute 0.6 0 - 0.7 K/uL   Basophils Relative 0 %   Basophils Absolute 0.0 0 - 0.1 K/uL    Comment: Performed at [facility], [address], [place], [place] [address]  Magnesium     Status: Abnormal   Collection Time: 06/01/17  4:38  AM  Result Value Ref Range   Magnesium 1.2 (L) 1.7 - 2.4 mg/dL    Comment: Performed at [facility], [address] [place], [place] [address]  Phosphorus     Status: None   Collection Time: 06/01/17  4:38 AM  Result Value Ref Range   Phosphorus 4.6 2.5 - 4.6 mg/dL    Comment: Performed at [facility], [address] [place], [place] [address]  Blood gas, arterial     Status: Abnormal   Collection Time: 06/01/17  5:22 AM  Result Value Ref Range   FIO2 0.30    Delivery systems VENTILATOR    Mode PRESSURE REGULATED VOLUME CONTROL    VT 400 mL   LHR 14 resp/min   Peep/cpap 5.0 cm H20   pH, Arterial 7.47 (H) 7.350 - 7.450   pCO2 arterial 41 32.0 - 48.0 mmHg   pO2, Arterial 88 83.0 - 108.0 mmHg   Bicarbonate 29.8 (H) 20.0 - 28.0 mmol/L   Acid-Base Excess 5.6 (H) 0.0 - 2.0 mmol/L   O2 Saturation 97.3 %   Patient temperature 37.0    Collection site RIGHT RADIAL    Sample type ARTERIAL DRAW    Allens test (pass/fail) PASS PASS    Comment: Performed at [facility], [address] [place], [place] [address]  Glucose, capillary     Status: Abnormal   Collection Time: 06/01/17  7:43 AM  Result Value Ref Range   Glucose-Capillary 244 (H) 65 - 99 mg/dL  Lactic acid, plasma     Status: None   Collection Time: 06/01/17  8:30 AM  Result Value Ref Range   Lactic Acid, Venous 1.3 0.5 - 1.9 mmol/L    Comment: Performed at [facility], [address] [place], [place] [address]  Glucose, capillary     Status: Abnormal   Collection Time: 06/01/17 12:10 PM  Result Value Ref Range   Glucose-Capillary  263 (H) 65 - 99 mg/dL   Comment 1 Document in Chart     Recent Results (from the past 240 hour(s))  C difficile quick scan w PCR reflex     Status: Abnormal   Collection Time: 05/28/17 12:03 AM  Result Value Ref Range Status   C Diff antigen POSITIVE (A) NEGATIVE Final   C Diff toxin POSITIVE (A) NEGATIVE Final   C Diff interpretation Toxin producing C. difficile detected.  Final    Comment: CRITICAL RESULT CALLED TO, READ BACK BY AND VERIFIED WITH: Tia Masker AT 0142 05/28/17 ALV   Gastrointestinal Panel by PCR , Stool     Status: None   Collection Time: 05/28/17 12:03 AM  Result Value Ref Range Status   Campylobacter species NOT DETECTED NOT DETECTED Final   Plesimonas shigelloides NOT DETECTED NOT DETECTED Final   Salmonella species NOT DETECTED NOT DETECTED Final   Yersinia enterocolitica NOT DETECTED NOT DETECTED Final   Vibrio species NOT DETECTED NOT DETECTED Final   Vibrio cholerae NOT DETECTED NOT DETECTED Final   Enteroaggregative E coli (EAEC) NOT DETECTED NOT DETECTED Final   Enteropathogenic E coli (EPEC) NOT DETECTED NOT DETECTED Final   Enterotoxigenic E coli (ETEC) NOT DETECTED NOT DETECTED Final   Shiga like toxin producing E coli (STEC) NOT DETECTED NOT DETECTED Final   Shigella/Enteroinvasive E coli (EIEC) NOT DETECTED NOT DETECTED Final   Cryptosporidium NOT DETECTED NOT DETECTED Final   Cyclospora cayetanensis NOT DETECTED NOT DETECTED Final   Entamoeba histolytica NOT DETECTED NOT DETECTED Final  Giardia lamblia NOT DETECTED NOT DETECTED Final   Adenovirus F40/41 NOT DETECTED NOT DETECTED Final   Astrovirus NOT DETECTED NOT DETECTED Final   Norovirus GI/GII NOT DETECTED NOT DETECTED Final   Rotavirus A NOT DETECTED NOT DETECTED Final   Sapovirus (I, II, IV, and V) NOT DETECTED NOT DETECTED Final  MRSA PCR Screening     Status: None   Collection Time: 05/28/17  3:28 AM  Result Value Ref Range Status   MRSA by PCR NEGATIVE NEGATIVE Final     Comment:        The GeneXpert MRSA Assay (FDA approved for NASAL specimens only), is one component of a comprehensive MRSA colonization surveillance program. It is not intended to diagnose MRSA infection nor to guide or monitor treatment for MRSA infections.   Culture, blood (Routine X 2) w Reflex to ID Panel     Status: None (Preliminary result)   Collection Time: 05/28/17  3:29 AM  Result Value Ref Range Status   Specimen Description BLOOD RIGHT HAND  Final   Special Requests   Final    BOTTLES DRAWN AEROBIC AND ANAEROBIC Blood Culture adequate volume   Culture   Final    NO GROWTH 4 DAYS Performed at [facility], [address], [place], [place] [address]    Report Status PENDING  Incomplete  Culture, blood (Routine X 2) w Reflex to ID Panel     Status: None (Preliminary result)   Collection Time: 05/28/17  3:35 AM  Result Value Ref Range Status   Specimen Description BLOOD LEFT HAND  Final   Special Requests   Final    BOTTLES DRAWN AEROBIC AND ANAEROBIC Blood Culture adequate volume   Culture   Final    NO GROWTH 4 DAYS Performed at [facility], [address], [place], [place] [address]    Report Status PENDING  Incomplete  Culture, respiratory (NON-Expectorated)     Status: None   Collection Time: 05/28/17  6:56 AM  Result Value Ref Range Status   Specimen Description   Final    TRACHEAL ASPIRATE Performed at [facility], [address], [place], [place] [address]    Special Requests   Final    NONE Performed at [facility], [address] [place], [place] [address]    Gram Stain   Final    FEW WBC PRESENT, PREDOMINANTLY PMN MODERATE GRAM POSITIVE COCCI IN CHAINS IN CLUSTERS FEW GRAM NEGATIVE RODS FEW GRAM NEGATIVE COCCOBACILLI FEW GRAM POSITIVE RODS    Culture   Final    Consistent with normal respiratory flora. Performed at [facility], [place] [address] [place], [place] [address]    Report Status  05/30/2017 FINAL  Final  Urine Culture     Status: Abnormal   Collection Time: 05/29/17 12:09 PM  Result Value Ref Range Status   Specimen Description   Final    URINE, CLEAN CATCH Performed at [facility], [address] [place], [place] [address]    Special Requests   Final    NONE Performed at [facility], [address] [place], [place] [address]    Culture MULTIPLE SPECIES PRESENT, SUGGEST RECOLLECTION (A)  Final   Report Status 05/30/2017 FINAL  Final  Urine Culture     Status: Abnormal   Collection Time: 05/31/17 10:50 AM  Result Value Ref Range Status   Specimen Description   Final    URINE, RANDOM Performed at [facility], [address] [place], [place] [address]    Special Requests   Final    NONE Performed at [facility], [place], [place] [address]    Culture 60,000 COLONIES/mL YEAST (A)  Final   Report Status 06/01/2017 FINAL  Final    Lipid Panel No results for input(s): CHOL, TRIG, HDL, CHOLHDL, VLDL, LDLCALC in the last 72 hours.  Studies/Results: Dg Chest Port 1 View  Result Date: 06/01/2017 CLINICAL DATA:  Evaluate for atelectasis EXAM: PORTABLE CHEST 1 VIEW COMPARISON:  Two days ago FINDINGS: Endotracheal tube tip just below the clavicular heads. An orogastric tube reaches the stomach. Right IJ line with tip at the right atrium. Haziness of the lower chest that is progressed, now with non visible right diaphragm. No visible Kerley lines or air bronchogram. No pneumothorax. Stable heart size.  Status post CABG. IMPRESSION: 1. Stable positioning of tubes and central line. 2. Worsening aeration at the bases, hazy appearance favoring atelectasis and pleural fluid. Electronically Signed   By: Monte Fantasia M.D.   On: 06/01/2017 07:08    Medications: I have reviewed the patient's current medications.  Assessment/Plan:  61 y.o. female who presented with PEA arrest unknown down time as last seen 2  hrs prior.  EMS arrived and 3 rounds of CPR and epinephrine were administered and ROSCwas achieved.  Pt has been warmed and off sedation for the past 2 days. Not following commands.    - Off sedation - Very poor exam as described above - case d/w daughter at bedside.  - Suspected very poor prognosis giving the current neurological exam - MRI will be done tomorrow to better prognosticate  - Daughter did state she understands current condition and the poor likely prognosis but family does not want to make any decisions prior to Christmas - Family wants to keep full code until Christmas time.     LOS: 4 days   Leotis Pain

## 2017-06-01 NOTE — Progress Notes (Signed)
[facility]  ROUNDING NOTE   Subjective:   UOP 970  Creatinine 3.88 (3.92)  Objective:  Vital signs in last 24 hours:  Temp:  [96.8 F (36 C)-98.6 F (37 C)] 97.3 F (36.3 C) (12/23 0900) Pulse Rate:  [61-83] 72 (12/23 0900) Resp:  [12-30] 15 (12/23 0900) BP: (126-176)/(49-101) 156/56 (12/23 0900) SpO2:  [95 %-100 %] 98 % (12/23 0900) FiO2 (%):  [30 %] 30 % (12/23 0700) Weight:  [74.2 kg (163 lb 9.3 oz)] 74.2 kg (163 lb 9.3 oz) (12/23 0500)  Weight change: 1.9 kg (4 lb 3 oz) Filed Weights   05/30/17 0318 05/31/17 0251 06/01/17 0500  Weight: 68 kg (149 lb 14.6 oz) 72.3 kg (159 lb 6.3 oz) 74.2 kg (163 lb 9.3 oz)    Intake/Output: I/O last 3 completed shifts: In: 7681 [I.V.:3500; Blood:340; NG/GT:933; IV Piggyback:500] Out: 1280 [Urine:1180; Stool:100]   Intake/Output this shift:  No intake/output data recorded.  Physical Exam: General: Critically ill  Head: ETT  Eyes: Anicteric, PERRL  Neck: Supple, trachea midline  Lungs:  PRVC FiO2 30%  Heart: Regular rate and rhythm  Abdomen:  Soft, nontender  Extremities: no peripheral edema.  Neurologic: Intubated, off sedation, not responding to stimuli  Skin: No lesions       Basic Metabolic Panel: Recent Labs  Lab 05/31/17 0549 05/31/17 0952 05/31/17 1440 05/31/17 1804 06/01/17 0438  NA 134*  134* 133* 133* 134* 136  K 4.0  3.9 3.8 3.6 3.7 3.5  CL 105  104 103 101 101 100*  CO2 21*  _0 GLUCOSE 178*  182* 184* 221* 251* 254*  BUN 42*  41* 41* 46* 48* 51*  CREATININE 3.89*  3.85* 3.87* 4.03* 3.92* 3.88*  CALCIUM 7.1*  7.1* 7.1* 6.8* 6.8* 7.2*  MG 1.3*  --   --   --  1.2*  PHOS 4.4  --   --   --  4.6    Liver Function Tests: Recent Labs  Lab 06/01/2017 2220 05/30/17 1316 05/31/17 0549 06/01/17 0438  AST 54* 36  --  54*  ALT 26 12*  --  13*  ALKPHOS 95 73  --  100  BILITOT 0.4 0.4  --  0.4  PROT 6.5 4.5*  --  5.2*  ALBUMIN 2.6* 1.6* 1.8* 1.8*   No results for  input(s): LIPASE, AMYLASE in the last 168 hours. No results for input(s): AMMONIA in the last 168 hours.  CBC: Recent Labs  Lab 05/13/2017 2220 05/28/17 0307 05/29/17 0416 05/30/17 0406 05/30/17 1316 05/30/17 2337 05/31/17 0647 06/01/17 0438  WBC 10.2 6.3 10.4 12.4*  --   --  10.9 9.4  NEUTROABS 7.3*  --   --   --   --   --   --  7.5*  HGB 8.1* 7.8* 8.2* 7.4* 7.0* 6.6* 7.7* 8.6*  HCT 25.5* 23.7* 25.0* 23.5* 21.5* 20.0* 23.6* 25.8*  MCV 93.2 90.2 89.4 90.9  --   --  88.6 88.1  PLT 232 204 245 266  --   --  207 217    Cardiac Enzymes: Recent Labs  Lab 05/28/2017 2220 05/28/17 0307 05/28/17 0643 05/28/17 1256 05/28/17 1756  TROPONINI 0.03* 0.06* 0.05* 0.04* 0.04*    BNP: Invalid input(s): POCBNP  CBG: Recent Labs  Lab 05/31/17 1556 05/31/17 1944 06/01/17 0003 06/01/17 0416 06/01/17 0743  GLUCAP 231* 248* 241* 260* 244*    Microbiology: Results for orders placed or performed during the hospital  encounter of 06/02/2017  C difficile quick scan w PCR reflex     Status: Abnormal   Collection Time: 05/28/17 12:03 AM  Result Value Ref Range Status   C Diff antigen POSITIVE (A) NEGATIVE Final   C Diff toxin POSITIVE (A) NEGATIVE Final   C Diff interpretation Toxin producing C. difficile detected.  Final    Comment: CRITICAL RESULT CALLED TO, READ BACK BY AND VERIFIED WITH: Tia Masker AT 0142 05/28/17 ALV   Gastrointestinal Panel by PCR , Stool     Status: None   Collection Time: 05/28/17 12:03 AM  Result Value Ref Range Status   Campylobacter species NOT DETECTED NOT DETECTED Final   Plesimonas shigelloides NOT DETECTED NOT DETECTED Final   Salmonella species NOT DETECTED NOT DETECTED Final   Yersinia enterocolitica NOT DETECTED NOT DETECTED Final   Vibrio species NOT DETECTED NOT DETECTED Final   Vibrio cholerae NOT DETECTED NOT DETECTED Final   Enteroaggregative E coli (EAEC) NOT DETECTED NOT DETECTED Final   Enteropathogenic E coli (EPEC) NOT DETECTED NOT  DETECTED Final   Enterotoxigenic E coli (ETEC) NOT DETECTED NOT DETECTED Final   Shiga like toxin producing E coli (STEC) NOT DETECTED NOT DETECTED Final   Shigella/Enteroinvasive E coli (EIEC) NOT DETECTED NOT DETECTED Final   Cryptosporidium NOT DETECTED NOT DETECTED Final   Cyclospora cayetanensis NOT DETECTED NOT DETECTED Final   Entamoeba histolytica NOT DETECTED NOT DETECTED Final   Giardia lamblia NOT DETECTED NOT DETECTED Final   Adenovirus F40/41 NOT DETECTED NOT DETECTED Final   Astrovirus NOT DETECTED NOT DETECTED Final   Norovirus GI/GII NOT DETECTED NOT DETECTED Final   Rotavirus A NOT DETECTED NOT DETECTED Final   Sapovirus (I, II, IV, and V) NOT DETECTED NOT DETECTED Final  MRSA PCR Screening     Status: None   Collection Time: 05/28/17  3:28 AM  Result Value Ref Range Status   MRSA by PCR NEGATIVE NEGATIVE Final    Comment:        The GeneXpert MRSA Assay (FDA approved for NASAL specimens only), is one component of a comprehensive MRSA colonization surveillance program. It is not intended to diagnose MRSA infection nor to guide or monitor treatment for MRSA infections.   Culture, blood (Routine X 2) w Reflex to ID Panel     Status: None (Preliminary result)   Collection Time: 05/28/17  3:29 AM  Result Value Ref Range Status   Specimen Description BLOOD RIGHT HAND  Final   Special Requests   Final    BOTTLES DRAWN AEROBIC AND ANAEROBIC Blood Culture adequate volume   Culture   Final    NO GROWTH 4 DAYS Performed at [facility], [address], [place], [place] [address]    Report Status PENDING  Incomplete  Culture, blood (Routine X 2) w Reflex to ID Panel     Status: None (Preliminary result)   Collection Time: 05/28/17  3:35 AM  Result Value Ref Range Status   Specimen Description BLOOD LEFT HAND  Final   Special Requests   Final    BOTTLES DRAWN AEROBIC AND ANAEROBIC Blood Culture adequate volume   Culture   Final    NO GROWTH 4  DAYS Performed at [facility], [address], [place], [place] [address]    Report Status PENDING  Incomplete  Culture, respiratory (NON-Expectorated)     Status: None   Collection Time: 05/28/17  6:56 AM  Result Value Ref Range Status   Specimen  Description   Final    TRACHEAL ASPIRATE Performed at [facility], [address] [place], [place] [address]    Special Requests   Final    NONE Performed at [facility], [address] [place], [place] [address]    Gram Stain   Final    FEW WBC PRESENT, PREDOMINANTLY PMN MODERATE GRAM POSITIVE COCCI IN CHAINS IN CLUSTERS FEW GRAM NEGATIVE RODS FEW GRAM NEGATIVE COCCOBACILLI FEW GRAM POSITIVE RODS    Culture   Final    Consistent with normal respiratory flora. Performed at [facility], [place] [address] [place], [place] [address]    Report Status 05/30/2017 FINAL  Final  Urine Culture     Status: Abnormal   Collection Time: 05/29/17 12:09 PM  Result Value Ref Range Status   Specimen Description   Final    URINE, CLEAN CATCH Performed at [facility], [address] [place], [place] [address]    Special Requests   Final    NONE Performed at [facility], [address] [place], [place] [address]    Culture MULTIPLE SPECIES PRESENT, SUGGEST RECOLLECTION (A)  Final   Report Status 05/30/2017 FINAL  Final    Coagulation Studies: Recent Labs    05/31/17 0119  LABPROT 15.8*  INR 1.27    Urinalysis: Recent Labs    05/29/17 1209  COLORURINE AMBER*  LABSPEC 1.018  PHURINE 5.0  GLUCOSEU 50*  HGBUR LARGE*  BILIRUBINUR NEGATIVE  KETONESUR NEGATIVE  PROTEINUR 100*  NITRITE POSITIVE*  LEUKOCYTESUR LARGE*      Imaging: Dg Chest Port 1 View  Result Date: 06/01/2017 CLINICAL DATA:  Evaluate for atelectasis EXAM: PORTABLE CHEST 1 VIEW COMPARISON:  Two days ago FINDINGS: Endotracheal tube tip just below the clavicular heads. An orogastric tube reaches the  stomach. Right IJ line with tip at the right atrium. Haziness of the lower chest that is progressed, now with non visible right diaphragm. No visible Kerley lines or air bronchogram. No pneumothorax. Stable heart size.  Status post CABG. IMPRESSION: 1. Stable positioning of tubes and central line. 2. Worsening aeration at the bases, hazy appearance favoring atelectasis and pleural fluid. Electronically Signed   By: Monte Fantasia M.D.   On: 06/01/2017 07:08     Medications:   . sodium chloride    . ceFEPime (MAXIPIME) IV Stopped (05/31/17 1330)  . feeding supplement (VITAL HIGH PROTEIN) 1,000 mL (06/01/17 0600)  . fentaNYL Stopped (05/30/17 0730)  . magnesium sulfate 1 - 4 g bolus IVPB    . metronidazole 500 mg (06/01/17 0830)  . norepinephrine (LEVOPHED) Adult infusion Stopped (05/30/17 1500)  .  sodium bicarbonate  infusion 1000 mL 100 mL/hr at 06/01/17 0600   . aspirin  81 mg Per Tube Daily  . chlorhexidine gluconate (MEDLINE KIT)  15 mL Mouth Rinse BID  . famotidine  20 mg Per Tube QHS  . feeding supplement (PRO-STAT SUGAR FREE 64)  30 mL Per Tube QID  . fentaNYL (SUBLIMAZE) injection  100 mcg Intravenous Once  . heparin injection (subcutaneous)  5,000 Units Subcutaneous Q8H  . mouth rinse  15 mL Mouth Rinse 10 times per day  . multivitamin  15 mL Per Tube Daily  . pantoprazole (PROTONIX) IV  40 mg Intravenous Q12H  . vancomycin  500 mg Per Tube Q6H   sodium chloride, fentaNYL, sodium chloride flush  Assessment/ Plan:  Kim Stevens is a 61 y.o. white female with coronary artery  disease status post CABG on 04/2017, hypertension, hyperlipidemia, diabetes mellitus type II insulin dependent, diabetic neuropathy, irritable bowel syndrome,  who was admitted to [facility] on 05/11/2017 for Cardiac arrest ([place]) [I46.9] PEA (Pulseless electrical activity) ([place]) [I46.9]   1. Acute renal failure on chronic kidney disease stage III with proteinuria: baseline creatinine of 1.5, GFR of 35 on  05/22/17.  Chronic Kidney Disease secondary to diabetic nephropathy Acute renal failure from ATN. Complicated by metabolic acidosis and hyponatremia No acute indication for dialysis. Discussed dialysis with patient's family.  Nonoliguric urine output Currently on IV D5LR with 20KCl at 138m/hr - Continue IVF: bicarb gtt at 1083mhr - reviewed renal ultrasound - monitor urine output, volume status, electrolytes and renal function.   2. Cardiogenic shock, sepsis, hypovolemia and acute colitis: C. Diff positive - on metronidazole - on empiric cefepime and vancomycin.  - Appreciate cards and critical care input.   3. Respiratory failure: requiring mechanical ventilation.   Discussed case with Critical care and Hospitalist.   Discussed plan of care, prognosis and goals of therapy with husband.      LOS: 4 [place]2/23/201810:20 AM

## 2017-06-01 NOTE — Progress Notes (Signed)
Pt is on tube feed and has q4 hours FSBS ordered with no SSI order currently. Maggie, NP notified of elevated FSBS  this shift at 326. See new order for SSI coverage, entered by NP.

## 2017-06-02 ENCOUNTER — Inpatient Hospital Stay: Payer: BC Managed Care – PPO

## 2017-06-02 DIAGNOSIS — E119 Type 2 diabetes mellitus without complications: Secondary | ICD-10-CM

## 2017-06-02 DIAGNOSIS — J9601 Acute respiratory failure with hypoxia: Secondary | ICD-10-CM

## 2017-06-02 DIAGNOSIS — G931 Anoxic brain damage, not elsewhere classified: Secondary | ICD-10-CM

## 2017-06-02 LAB — CULTURE, BLOOD (ROUTINE X 2)
CULTURE: NO GROWTH
CULTURE: NO GROWTH
SPECIAL REQUESTS: ADEQUATE
Special Requests: ADEQUATE

## 2017-06-02 LAB — CBC WITH DIFFERENTIAL/PLATELET
Basophils Absolute: 0 10*3/uL (ref 0–0.1)
Basophils Relative: 0 %
EOS PCT: 6 %
Eosinophils Absolute: 0.5 10*3/uL (ref 0–0.7)
HEMATOCRIT: 25.9 % — AB (ref 35.0–47.0)
Hemoglobin: 8.7 g/dL — ABNORMAL LOW (ref 12.0–16.0)
LYMPHS ABS: 0.6 10*3/uL — AB (ref 1.0–3.6)
Lymphocytes Relative: 8 %
MCH: 29.3 pg (ref 26.0–34.0)
MCHC: 33.6 g/dL (ref 32.0–36.0)
MCV: 87.1 fL (ref 80.0–100.0)
MONO ABS: 0.6 10*3/uL (ref 0.2–0.9)
MONOS PCT: 7 %
NEUTROS ABS: 6.2 10*3/uL (ref 1.4–6.5)
Neutrophils Relative %: 79 %
Platelets: 235 10*3/uL (ref 150–440)
RBC: 2.97 MIL/uL — ABNORMAL LOW (ref 3.80–5.20)
RDW: 17.2 % — AB (ref 11.5–14.5)
WBC: 7.9 10*3/uL (ref 3.6–11.0)

## 2017-06-02 LAB — GLUCOSE, CAPILLARY
GLUCOSE-CAPILLARY: 278 mg/dL — AB (ref 65–99)
GLUCOSE-CAPILLARY: 184 mg/dL — AB (ref 65–99)
GLUCOSE-CAPILLARY: 122 mg/dL — AB (ref 65–99)
GLUCOSE-CAPILLARY: 123 mg/dL — AB (ref 65–99)
Glucose-Capillary: 125 mg/dL — ABNORMAL HIGH (ref 65–99)
Glucose-Capillary: 225 mg/dL — ABNORMAL HIGH (ref 65–99)
Glucose-Capillary: 258 mg/dL — ABNORMAL HIGH (ref 65–99)
Glucose-Capillary: 326 mg/dL — ABNORMAL HIGH (ref 65–99)

## 2017-06-02 LAB — COMPREHENSIVE METABOLIC PANEL
ALBUMIN: 1.8 g/dL — AB (ref 3.5–5.0)
ALT: 11 U/L — ABNORMAL LOW (ref 14–54)
ANION GAP: 9 (ref 5–15)
AST: 56 U/L — ABNORMAL HIGH (ref 15–41)
Alkaline Phosphatase: 100 U/L (ref 38–126)
BUN: 56 mg/dL — ABNORMAL HIGH (ref 6–20)
CO2: 34 mmol/L — AB (ref 22–32)
Calcium: 6.9 mg/dL — ABNORMAL LOW (ref 8.9–10.3)
Chloride: 94 mmol/L — ABNORMAL LOW (ref 101–111)
Creatinine, Ser: 3.64 mg/dL — ABNORMAL HIGH (ref 0.44–1.00)
GFR calc Af Amer: 15 mL/min — ABNORMAL LOW (ref >60)
GFR calc non Af Amer: 13 mL/min — ABNORMAL LOW (ref >60)
GLUCOSE: 267 mg/dL — AB (ref 65–99)
POTASSIUM: 3 mmol/L — AB (ref 3.5–5.1)
SODIUM: 137 mmol/L (ref 135–145)
Total Bilirubin: 0.4 mg/dL (ref 0.3–1.2)
Total Protein: 5.2 g/dL — ABNORMAL LOW (ref 6.5–8.1)

## 2017-06-02 LAB — PHOSPHORUS: Phosphorus: 4.1 mg/dL (ref 2.5–4.6)

## 2017-06-02 LAB — MAGNESIUM: Magnesium: 1.7 mg/dL (ref 1.7–2.4)

## 2017-06-02 MED ORDER — VITAL HIGH PROTEIN PO LIQD: 1000.0000 mL | ORAL | Status: DC

## 2017-06-02 MED ORDER — POTASSIUM CHLORIDE 20 MEQ PO PACK
40.0000 meq | PACK | Freq: Once | ORAL | Status: DC
  Filled 2017-06-02: qty 2

## 2017-06-02 MED ORDER — INSULIN ASPART 100 UNIT/ML ~~LOC~~ SOLN
0.0000 [IU] | SUBCUTANEOUS | Status: DC
  Administered 2017-06-02: 3 [IU] via SUBCUTANEOUS
  Administered 2017-06-02: 1 [IU] via SUBCUTANEOUS
  Administered 2017-06-02: 5 [IU] via SUBCUTANEOUS
  Administered 2017-06-02: 11 [IU] via SUBCUTANEOUS
  Administered 2017-06-02: 8 [IU] via SUBCUTANEOUS
  Administered 2017-06-02: 2 [IU] via SUBCUTANEOUS
  Administered 2017-06-03: 3 [IU] via SUBCUTANEOUS
  Administered 2017-06-03: 2 [IU] via SUBCUTANEOUS
  Filled 2017-06-02 (×8): qty 1

## 2017-06-02 MED ORDER — INSULIN ASPART 100 UNIT/ML ~~LOC~~ SOLN: 0.0000 [IU] | SUBCUTANEOUS | Status: DC

## 2017-06-02 MED ORDER — POTASSIUM CHLORIDE 10 MEQ/50ML IV SOLN
10.0000 meq | INTRAVENOUS | Status: DC
  Administered 2017-06-02 (×4): 10 meq via INTRAVENOUS
  Filled 2017-06-02 (×4): qty 50

## 2017-06-02 MED ORDER — DEXTROSE 5 % IV SOLN
1.0000 g | INTRAVENOUS | Status: DC
  Administered 2017-06-02: 1 g via INTRAVENOUS
  Filled 2017-06-02 (×2): qty 1

## 2017-06-02 MED ORDER — MAGNESIUM SULFATE 2 GM/50ML IV SOLN
2.0000 g | Freq: Once | INTRAVENOUS | Status: AC
  Administered 2017-06-02: 2 g via INTRAVENOUS
  Filled 2017-06-02: qty 50

## 2017-06-02 MED ORDER — VITAL AF 1.2 CAL PO LIQD
1000.0000 mL | ORAL | Status: DC
  Administered 2017-06-02: 1000 mL

## 2017-06-02 MED ORDER — PRO-STAT SUGAR FREE PO LIQD
30.0000 mL | Freq: Two times a day (BID) | ORAL | Status: DC
  Administered 2017-06-02: 30 mL

## 2017-06-02 MED ORDER — POTASSIUM CHLORIDE 20 MEQ PO PACK
40.0000 meq | PACK | Freq: Once | ORAL | Status: AC
  Administered 2017-06-02: 40 meq
  Filled 2017-06-02: qty 2

## 2017-06-02 MED ORDER — VITAL AF 1.2 CAL PO LIQD
1000.0000 mL | ORAL | Status: DC
  Administered 2017-06-03: 1000 mL

## 2017-06-02 MED ORDER — INSULIN GLARGINE 100 UNIT/ML ~~LOC~~ SOLN
20.0000 [IU] | Freq: Every day | SUBCUTANEOUS | Status: DC
  Administered 2017-06-02: 20 [IU] via SUBCUTANEOUS
  Filled 2017-06-02: qty 0.2

## 2017-06-02 MED ORDER — INSULIN GLARGINE 100 UNIT/ML ~~LOC~~ SOLN
10.0000 [IU] | Freq: Every day | SUBCUTANEOUS | Status: DC
  Filled 2017-06-02: qty 0.1

## 2017-06-02 NOTE — Progress Notes (Addendum)
Nutrition Brief Follow-Up Note  Recommend providing Vital AF 1.2 at 40 mL/hr (960 mL goal daily volume) + Pro-Stat 30 mL BID via OGT. Provides 1352 kcal, 102 grams of protein, 778 mL H2O daily.  Also recommend liquid MVI daily per tube as goal TF regimen does not meet 100% RDIs for vitamins/minerals.  Assessment: Access: 16 Fr. OGT placed 12/18; tip and side port are below diaphragm per chest x-ray today (no abdominal x-ray); 53 cm at corner of mouth  Patient is currently intubated on ventilator support MV: 6.1 L/min Temp (24hrs), Avg:96.7 F (35.9 C), Min:95.7 F (35.4 C), Max:97.3 F (36.3 C)  Propofol: N/A  Patient has been tolerating tube feeds well.  Medications reviewed and include: famotidine, Novolog 0-15 units Q4hrs, Lantus 20 units daily, liquid MVI daily, potassium chloride 40 mEq once today per tube, vancomycin, potassium chloride 10 mEq 4 times today IV.  Labs reviewed: CBG 225-326, Potassium 3, Chloride 94, CO2 34, BUN 56, Creatinine 3.64.  Estimated Nutritional Needs:  Kcal:  0802 (PSU 2003b w/ MSJ 2336, Ve 5.5, Tmax 37) Protein:  90-110 grams (1.3-1.6 grams/kg) Fluid:  2-2.4 L/day (30-35 mL/kg)  Willey Blade, MS, RD, LDN Office: [phone] Pager: [phone] After Hours/Weekend Pager: [phone]

## 2017-06-02 NOTE — Progress Notes (Signed)
Late entry: Maggie, NP notified of patients hgb down to 6.6, new orders obtained to transfuse 1 unit pRBC's. See new orders.

## 2017-06-02 NOTE — Progress Notes (Signed)
[facility] [place] NAME: Kim Stevens    MR#:  [id number]  DATE OF BIRTH:  Sep 27, 1955  SUBJECTIVE:  CHIEF COMPLAINT:   Chief Complaint  Patient presents with  . Cardiac Arrest   - cardiac arrest, recent CABG 4 weeks ago - Off sedation for more than 72 hours with no improvement in her mental status. Patient is not responding. Vitals are stable on vent. Receiving tube feedings  REVIEW OF SYSTEMS:  Review of Systems  Unable to perform ROS: Critical illness  Intubated and sedated  DRUG ALLERGIES:   Allergies  Allergen Reactions  . Meperidine Nausea And Vomiting       . Oxycodone-Acetaminophen Nausea And Vomiting         VITALS:  Blood pressure (!) 171/65, pulse 73, temperature (!) 95.9 F (35.5 C), resp. rate 18, height 5\' 4"  (1.626 m), weight 75.5 kg (166 lb 7.2 oz), SpO2 96 %.  PHYSICAL EXAMINATION:  Physical Exam  GENERAL:  61 y.o.-year-old patient lying in the bed, critically ill appearing.  EYES: Pupils pinpoint with no reaction to light. No scleral icterus.  HEENT: Head atraumatic, normocephalic. Oropharynx and nasopharynx clear.  NECK:  Supple, no jugular venous distention. No thyroid enlargement, no tenderness.  LUNGS: Normal breath sounds bilaterally, no wheezing, rales,rhonchi or crepitation. No use of accessory muscles of respiration. Decreased at the bases CARDIOVASCULAR: S1, S2 normal. No murmurs, rubs, or gallops.  CABG incision with gaping noted- dressing in place- no purulence noted. Superficial midline sternal incision dehiscence present. ABDOMEN: Soft, nontender, nondistended. Hypoactive Bowel sounds present. No organomegaly or mass.  EXTREMITIES: No pedal edema, cyanosis, or clubbing.  NEUROLOGIC: not responding currently. No withdrawal to tactile stimulus or even pain. No posturing noted. PSYCHIATRIC: The patient is obtunded.  SKIN: No obvious rash, lesion, or ulcer.    LABORATORY PANEL:   CBC Recent  Labs  Lab 06/02/17 0449  WBC 7.9  HGB 8.7*  HCT 25.9*  PLT 235   ------------------------------------------------------------------------------------------------------------------  Chemistries  Recent Labs  Lab 06/02/17 0449  NA 137  K 3.0*  CL 94*  CO2 34*  GLUCOSE 267*  BUN 56*  CREATININE 3.64*  CALCIUM 6.9*  MG 1.7  AST 56*  ALT 11*  ALKPHOS 100  BILITOT 0.4   ------------------------------------------------------------------------------------------------------------------  Cardiac Enzymes Recent Labs  Lab 05/28/17 1756  TROPONINI 0.04*   ------------------------------------------------------------------------------------------------------------------  RADIOLOGY:  Dg Chest Port 1 View  Result Date: 06/02/2017 CLINICAL DATA:  Hypoxia EXAM: PORTABLE CHEST 1 VIEW COMPARISON:  June 01, 2017 FINDINGS: Endotracheal tube tip is 2.0 cm above the carina. Nasogastric tube tip and side port are below the diaphragm. Central catheter tip is in the right atrium slightly beyond the cavoatrial junction. No pneumothorax. There are pleural effusions bilaterally with bibasilar consolidation. There is cardiomegaly with pulmonary vascularity within normal limits. There is aortic atherosclerosis. Patient is status post coronary artery bypass grafting. No bone lesions. IMPRESSION: Tube and catheter positions as described without pneumothorax. There is cardiomegaly with bilateral pleural effusions and bibasilar consolidation. Question alveolar edema versus pneumonia in the bases. Both entities may exist concurrently. There is aortic atherosclerosis. Aortic Atherosclerosis (ICD10-I70.0). Electronically Signed   By: Lowella Grip III M.D.   On: 06/02/2017 07:12   Dg Chest Port 1 View  Result Date: 06/01/2017 CLINICAL DATA:  Evaluate for atelectasis EXAM: PORTABLE CHEST 1 VIEW COMPARISON:  Two days ago FINDINGS: Endotracheal tube tip just below the clavicular heads. An orogastric tube  reaches the stomach. Right IJ line with tip at the right atrium. Haziness of the lower chest that is progressed, now with non visible right diaphragm. No visible Kerley lines or air bronchogram. No pneumothorax. Stable heart size.  Status post CABG. IMPRESSION: 1. Stable positioning of tubes and central line. 2. Worsening aeration at the bases, hazy appearance favoring atelectasis and pleural fluid. Electronically Signed   By: Monte Fantasia M.D.   On: 06/01/2017 07:08    EKG:   Orders placed or performed during the hospital encounter of 05/26/2017  . EKG 12-Lead  . EKG 12-Lead  . EKG 12-Lead  . EKG 12-Lead  . EKG 12-Lead  . EKG 12-Lead    ASSESSMENT AND PLAN:   61 year old female with past medical history significant for idiopathic cardiomyopathy with EF of 45%, hypertension, DM and CKD who had CABG at [facility] 4 weeks ago was brought in from rehab due to cardiac arrest.  1. Cardiac arrest-  ? Sepsis vs respiratory cause likely - CT chest negative for PE - appreciate cardiology and intensivist consult - ECHO done, less likely to be cardiac per cardiology - hypothermia protocol finished > 72 hrs and no improvement in mental status -Remains intubated  2. Sepsis- off  levophed, elevated procalcitonin - stool for c.diff positive, no pneumonia noted- off cefepime for atelectasis and UTI. Urine appears contaminated - on oral vancomycin   for C. difficile colitis  3. ARF- likely from ATN- with cardiac arrest, sepsis - creatinine at discharge was 1.5  last week, now worsening, cr at 3.6- but urine output has improved. - nephrology consult.  -on bicarbonate drip -No acute indication for dialysis. Making minimal amount of urine. Continue to monitor.  4. Anemia of chronic disease and also from recent surgery- hb at discharge was around 7.5 last week, Steady drop noted after admission, Received one unit transfusion  - hb has been stable at 8.7 today  5. Acute  encephalopathy-anoxic encephalopathy. -CT head without any acute findings. Off sedation for more than 72 hours. -Appreciate neurology consult. Poor prognosis. MRI scheduled for tomorrow. -EEG for prognostic indication.  6. DVT Prophylaxis- heparin SQ  7. Hypokalemia and hypomagnesemia-Replaced appropriately   Critically ill, overall poor prognosis. Family wants to continue to monitor over Christmas and make a decision after that.    All the records are reviewed and case discussed with Care Management/Social Workerr. Management plans discussed with the patient, family and they are in agreement.  CODE STATUS: Full Code  TOTAL TIME TAKING CARE OF THIS PATIENT: 36 minutes.   POSSIBLE D/C IN 2-3 DAYS, DEPENDING ON CLINICAL CONDITION.   Gladstone Lighter M.D on 06/02/2017 at 12:19 PM  Between 7am to 6pm - Pager - [phone]  After 6pm go to [email or web address] - password [email or web address] [facility]  Office  [phone]  CC: Primary care physician; Rudi Coco, MD (Inactive)

## 2017-06-02 NOTE — Progress Notes (Signed)
[facility]  ROUNDING NOTE   Subjective:   UOP 1125 (970)  Creatinine 3.64 (3.88) (3.92)  hgb 8.7  Vancomycin  Given KCl  Objective:  Vital signs in last 24 hours:  Temp:  [95.7 F (35.4 C)-97.3 F (36.3 C)] 96.3 F (35.7 C) (12/24 0900) Pulse Rate:  [66-75] 70 (12/24 0900) Resp:  [0-16] 15 (12/24 0900) BP: (141-172)/(53-69) 141/58 (12/24 0900) SpO2:  [96 %-99 %] 98 % (12/24 0900) FiO2 (%):  [30 %] 30 % (12/24 0800) Weight:  [75.5 kg (166 lb 7.2 oz)] 75.5 kg (166 lb 7.2 oz) (12/24 0500)  Weight change: 1.3 kg (2 lb 13.9 oz) Filed Weights   05/31/17 0251 06/01/17 0500 06/02/17 0500  Weight: 72.3 kg (159 lb 6.3 oz) 74.2 kg (163 lb 9.3 oz) 75.5 kg (166 lb 7.2 oz)    Intake/Output: I/O last 3 completed shifts: In: 7078 [I.V.:3503; NG/GT:740; IV Piggyback:200] Out: 6754 [Urine:1745]   Intake/Output this shift:  Total I/O In: 150 [NG/GT:50; IV Piggyback:100] Out: -   Physical Exam: General: Critically ill  Head: ETT  Eyes: Anicteric, PERRL  Neck: Supple, trachea midline  Lungs:  PRVC FiO2 30%  Heart: Regular rate and rhythm  Abdomen:  Soft, nontender  Extremities: no peripheral edema.  Neurologic: Intubated, off sedation, not responding to stimuli  Skin: No lesions       Basic Metabolic Panel: Recent Labs  Lab 05/31/17 0549 05/31/17 0952 05/31/17 1440 05/31/17 1804 06/01/17 0438 06/02/17 0449  NA 134*  134* 133* 133* 134* 136 137  K 4.0  3.9 3.8 3.6 3.7 3.5 3.0*  CL 105  104 103 101 101 100* 94*  CO2 21*  22 23 26 24 26  34*  GLUCOSE 178*  182* 184* 221* 251* 254* 267*  BUN 42*  41* 41* 46* 48* 51* 56*  CREATININE 3.89*  3.85* 3.87* 4.03* 3.92* 3.88* 3.64*  CALCIUM 7.1*  7.1* 7.1* 6.8* 6.8* 7.2* 6.9*  MG 1.3*  --   --   --  1.2* 1.7  PHOS 4.4  --   --   --  4.6 4.1    Liver Function Tests: Recent Labs  Lab 05/28/2017 2220 05/30/17 1316 05/31/17 0549 06/01/17 0438 06/02/17 0449  AST 54* 36  --  54* 56*  ALT 26 12*  --   13* 11*  ALKPHOS 95 73  --  100 100  BILITOT 0.4 0.4  --  0.4 0.4  PROT 6.5 4.5*  --  5.2* 5.2*  ALBUMIN 2.6* 1.6* 1.8* 1.8* 1.8*   No results for input(s): LIPASE, AMYLASE in the last 168 hours. No results for input(s): AMMONIA in the last 168 hours.  CBC: Recent Labs  Lab 06/06/2017 2220  05/29/17 0416 05/30/17 0406 05/30/17 1316 05/30/17 2337 05/31/17 0647 06/01/17 0438 06/02/17 0449  WBC 10.2   < > 10.4 12.4*  --   --  10.9 9.4 7.9  NEUTROABS 7.3*  --   --   --   --   --   --  7.5* 6.2  HGB 8.1*   < > 8.2* 7.4* 7.0* 6.6* 7.7* 8.6* 8.7*  HCT 25.5*   < > 25.0* 23.5* 21.5* 20.0* 23.6* 25.8* 25.9*  MCV 93.2   < > 89.4 90.9  --   --  88.6 88.1 87.1  PLT 232   < > 245 266  --   --  207 217 235   < > = values in this interval not displayed.  Cardiac Enzymes: Recent Labs  Lab 06/02/2017 2220 05/28/17 0307 05/28/17 0643 05/28/17 1256 05/28/17 1756  TROPONINI 0.03* 0.06* 0.05* 0.04* 0.04*    BNP: Invalid input(s): POCBNP  CBG: Recent Labs  Lab 06/01/17 1947 06/01/17 2355 06/02/17 0459 06/02/17 0606 06/02/17 0739  GLUCAP 305* 326* 258* 278* 225*    Microbiology: Results for orders placed or performed during the hospital encounter of 05/11/2017  C difficile quick scan w PCR reflex     Status: Abnormal   Collection Time: 05/28/17 12:03 AM  Result Value Ref Range Status   C Diff antigen POSITIVE (A) NEGATIVE Final   C Diff toxin POSITIVE (A) NEGATIVE Final   C Diff interpretation Toxin producing C. difficile detected.  Final    Comment: CRITICAL RESULT CALLED TO, READ BACK BY AND VERIFIED WITH: Tia Masker AT 0142 05/28/17 ALV   Gastrointestinal Panel by PCR , Stool     Status: None   Collection Time: 05/28/17 12:03 AM  Result Value Ref Range Status   Campylobacter species NOT DETECTED NOT DETECTED Final   Plesimonas shigelloides NOT DETECTED NOT DETECTED Final   Salmonella species NOT DETECTED NOT DETECTED Final   Yersinia enterocolitica NOT DETECTED  NOT DETECTED Final   Vibrio species NOT DETECTED NOT DETECTED Final   Vibrio cholerae NOT DETECTED NOT DETECTED Final   Enteroaggregative E coli (EAEC) NOT DETECTED NOT DETECTED Final   Enteropathogenic E coli (EPEC) NOT DETECTED NOT DETECTED Final   Enterotoxigenic E coli (ETEC) NOT DETECTED NOT DETECTED Final   Shiga like toxin producing E coli (STEC) NOT DETECTED NOT DETECTED Final   Shigella/Enteroinvasive E coli (EIEC) NOT DETECTED NOT DETECTED Final   Cryptosporidium NOT DETECTED NOT DETECTED Final   Cyclospora cayetanensis NOT DETECTED NOT DETECTED Final   Entamoeba histolytica NOT DETECTED NOT DETECTED Final   Giardia lamblia NOT DETECTED NOT DETECTED Final   Adenovirus F40/41 NOT DETECTED NOT DETECTED Final   Astrovirus NOT DETECTED NOT DETECTED Final   Norovirus GI/GII NOT DETECTED NOT DETECTED Final   Rotavirus A NOT DETECTED NOT DETECTED Final   Sapovirus (I, II, IV, and V) NOT DETECTED NOT DETECTED Final  MRSA PCR Screening     Status: None   Collection Time: 05/28/17  3:28 AM  Result Value Ref Range Status   MRSA by PCR NEGATIVE NEGATIVE Final    Comment:        The GeneXpert MRSA Assay (FDA approved for NASAL specimens only), is one component of a comprehensive MRSA colonization surveillance program. It is not intended to diagnose MRSA infection nor to guide or monitor treatment for MRSA infections.   Culture, blood (Routine X 2) w Reflex to ID Panel     Status: None   Collection Time: 05/28/17  3:29 AM  Result Value Ref Range Status   Specimen Description BLOOD RIGHT HAND  Final   Special Requests   Final    BOTTLES DRAWN AEROBIC AND ANAEROBIC Blood Culture adequate volume   Culture   Final    NO GROWTH 5 DAYS Performed at [facility], [address] [place], [place] [address]    Report Status 06/02/2017 FINAL  Final  Culture, blood (Routine X 2) w Reflex to ID Panel     Status: None   Collection Time: 05/28/17  3:35 AM  Result Value Ref Range  Status   Specimen Description BLOOD LEFT HAND  Final   Special Requests   Final    BOTTLES DRAWN AEROBIC AND ANAEROBIC Blood  Culture adequate volume   Culture   Final    NO GROWTH 5 DAYS Performed at [facility], [address] [place], [place] [address]    Report Status 06/02/2017 FINAL  Final  Culture, respiratory (NON-Expectorated)     Status: None   Collection Time: 05/28/17  6:56 AM  Result Value Ref Range Status   Specimen Description   Final    TRACHEAL ASPIRATE Performed at [facility], [address], [place], [place] [address]    Special Requests   Final    NONE Performed at [facility], [address] [place], [place] [address]    Gram Stain   Final    FEW WBC PRESENT, PREDOMINANTLY PMN MODERATE GRAM POSITIVE COCCI IN CHAINS IN CLUSTERS FEW GRAM NEGATIVE RODS FEW GRAM NEGATIVE COCCOBACILLI FEW GRAM POSITIVE RODS    Culture   Final    Consistent with normal respiratory flora. Performed at [facility], [place] [address], [place], [place] [address]    Report Status 05/30/2017 FINAL  Final  Urine Culture     Status: Abnormal   Collection Time: 05/29/17 12:09 PM  Result Value Ref Range Status   Specimen Description   Final    URINE, CLEAN CATCH Performed at [facility], [address], [place], [place] [address]    Special Requests   Final    NONE Performed at [facility], [address] [place], [place] [address]    Culture MULTIPLE SPECIES PRESENT, SUGGEST RECOLLECTION (A)  Final   Report Status 05/30/2017 FINAL  Final  Urine Culture     Status: Abnormal   Collection Time: 05/31/17 10:50 AM  Result Value Ref Range Status   Specimen Description   Final    URINE, RANDOM Performed at [facility], [address] [place], [place] [address]    Special Requests   Final    NONE Performed at [facility], [place], [place] [address]    Culture 60,000 COLONIES/mL  YEAST (A)  Final   Report Status 06/01/2017 FINAL  Final    Coagulation Studies: Recent Labs    05/31/17 0119  LABPROT 15.8*  INR 1.27    Urinalysis: No results for input(s): COLORURINE, LABSPEC, PHURINE, GLUCOSEU, HGBUR, BILIRUBINUR, KETONESUR, PROTEINUR, UROBILINOGEN, NITRITE, LEUKOCYTESUR in the last 72 hours.  Invalid input(s): APPERANCEUR    Imaging: Dg Chest Port 1 View  Result Date: 06/02/2017 CLINICAL DATA:  Hypoxia EXAM: PORTABLE CHEST 1 VIEW COMPARISON:  June 01, 2017 FINDINGS: Endotracheal tube tip is 2.0 cm above the carina. Nasogastric tube tip and side port are below the diaphragm. Central catheter tip is in the right atrium slightly beyond the cavoatrial junction. No pneumothorax. There are pleural effusions bilaterally with bibasilar consolidation. There is cardiomegaly with pulmonary vascularity within normal limits. There is aortic atherosclerosis. Patient is status post coronary artery bypass grafting. No bone lesions. IMPRESSION: Tube and catheter positions as described without pneumothorax. There is cardiomegaly with bilateral pleural effusions and bibasilar consolidation. Question alveolar edema versus pneumonia in the bases. Both entities may exist concurrently. There is aortic atherosclerosis. Aortic Atherosclerosis (ICD10-I70.0). Electronically Signed   By: Lowella Grip III M.D.   On: 06/02/2017 07:12   Dg Chest Port 1 View  Result Date: 06/01/2017 CLINICAL DATA:  Evaluate for atelectasis EXAM: PORTABLE CHEST 1 VIEW COMPARISON:  Two days ago FINDINGS: Endotracheal tube tip just below the clavicular heads. An orogastric tube reaches the stomach. Right IJ  line with tip at the right atrium. Haziness of the lower chest that is progressed, now with non visible right diaphragm. No visible Kerley lines or air bronchogram. No pneumothorax. Stable heart size.  Status post CABG. IMPRESSION: 1. Stable positioning of tubes and central line. 2. Worsening aeration at  the bases, hazy appearance favoring atelectasis and pleural fluid. Electronically Signed   By: Monte Fantasia M.D.   On: 06/01/2017 07:08     Medications:   . sodium chloride    . feeding supplement (VITAL HIGH PROTEIN) 1,000 mL (06/02/17 0915)  . fentaNYL Stopped (05/30/17 0730)  . potassium chloride 10 mEq (06/02/17 1040)   . aspirin  81 mg Per Tube Daily  . chlorhexidine gluconate (MEDLINE KIT)  15 mL Mouth Rinse BID  . famotidine  20 mg Per Tube QHS  . feeding supplement (PRO-STAT SUGAR FREE 64)  30 mL Per Tube QID  . heparin injection (subcutaneous)  5,000 Units Subcutaneous Q8H  . insulin aspart  0-15 Units Subcutaneous Q4H  . insulin glargine  20 Units Subcutaneous Daily  . mouth rinse  15 mL Mouth Rinse 10 times per day  . multivitamin  15 mL Per Tube Daily  . potassium chloride  40 mEq Per Tube Once  . vancomycin  500 mg Per Tube Q6H   sodium chloride, sodium chloride flush  Assessment/ Plan:  Ms. Kim Stevens is a 61 y.o. white female with coronary artery disease status post CABG on 04/2017, hypertension, hyperlipidemia, diabetes mellitus type II insulin dependent, diabetic neuropathy, irritable bowel syndrome,  who was admitted to [facility] on 05/30/2017 for Cardiac arrest ([place]) [I46.9] PEA (Pulseless electrical activity) ([place]) [I46.9]   1. Acute renal failure on chronic kidney disease stage III with proteinuria: baseline creatinine of 1.5, GFR of 35 on 05/22/17.  Chronic Kidney Disease secondary to diabetic nephropathy Acute renal failure from ATN. Complicated by metabolic acidosis and hyponatremia No acute indication for dialysis. Discussed dialysis with patient's family.  Nonoliguric urine output - Continue IVF: bicarb gtt at 157m/hr - monitor urine output, volume status, electrolytes and renal function.   2. Cardiogenic shock, sepsis, hypovolemia and acute colitis: C. Diff positive - on metronidazole - on empiric cefepime and vancomycin.  - Appreciate cards and  critical care input.   3. Respiratory failure: requiring mechanical ventilation.   Discussed case with Critical care and Hospitalist. MRI for later today.    LOS: 5Leland SThurmond12/24/201810:46 AM

## 2017-06-02 NOTE — Progress Notes (Signed)
Pharmacy Antibiotic Note  Kim Stevens is a 61 y.o. female admitted on 05/22/2017 admitted to ICU.  Pharmacy consulted for cefepime dosing. Patient is being treated for clostridium difficile with oral vancomycin and IV metronidazole. Patient underwent targeted temperature management, completed warming at 1500 on 12/20.   Plan: Continue Cefepime 1g IV Q24hr for a total of 7 days per Dr. Alva Garnet.   Height: 5\' 4"  (162.6 cm) Weight: 166 lb 7.2 oz (75.5 kg) IBW/kg (Calculated) : 54.7  Temp (24hrs), Avg:96.5 F (35.8 C), Min:95.7 F (35.4 C), Max:97.3 F (36.3 C)  Recent Labs  Lab 05/28/17 0308  05/29/17 0416  05/30/17 0406  05/31/17 0647 05/31/17 0952 05/31/17 1440 05/31/17 1804 06/01/17 0438 06/01/17 0830 06/02/17 0449  WBC  --   --  10.4  --  12.4*  --  10.9  --   --   --  9.4  --  7.9  CREATININE  --    < >  --    < >  --    < >  --  3.87* 4.03* 3.92* 3.88*  --  3.64*  LATICACIDVEN 0.7  --   --   --   --   --   --   --   --   --  1.6 1.3  --    < > = values in this interval not displayed.    Estimated Creatinine Clearance: 16.3 mL/min (A) (by C-G formula based on SCr of 3.64 mg/dL (H)).    Allergies  Allergen Reactions  . Meperidine Nausea And Vomiting       . Oxycodone-Acetaminophen Nausea And Vomiting         Antimicrobials this admission: Vancomycin PO 12/19 >>  Metronidazole 12/20  >> 12/24 Cefepime 12/20 >>  Dose adjustments this admission: N/A  Microbiology results: 12/19 BCx: no growth 12/19 UCx: mbo 12/19 Sputum: nl flora 12/19 CDiff: positive  12/19 GI Panel: negative  12/19  MRSA PCR: negative   Thank you for allowing pharmacy to be a part of this patient's care.  Ulice Dash D 06/02/2017 1:10 PM

## 2017-06-02 NOTE — Progress Notes (Signed)
PULMONARY / CRITICAL CARE MEDICINE   Name: Kim Stevens MRN: [id number] DOB: [birth year]    ADMISSION DATE:  05/15/2017  PT PROFILE:   52 F underwent CABG 11/19 in @ [facility]. Hospitalization c/b cardiac arrest that was attributed to respiratory failure. Pt was ultimately transferred to a Rehab center. Was found by staff unresponsive and pulseless. EMS dispatched and initial rhythm was reportedly PEA. Underwent TTM protocol. Diarrhea noted and C diff PCR positive (toxin and antigen)  MAJOR EVENTS/TEST RESULTS: 12/18 admitted after OOH cardiac arrest. TTM protocol 12/18 CTA chest: no PE. Cardiomegaly with post CABG changes.  Trace pericardial fluid. Small pleural effusions. Partial consolidations in the lower lobes, may reflect atelectasis, or mild pneumonia  12/19 CT head: no acute findings 12/19 Cardiology consultation 12/19 Echocardiogram: LVEF 50-55%.  Grade 1 diastolic dysfunction.  Mild MR. Moderate TR. PAP normal.  12/20 Rewarmed. Unresponsive off of sedatives. CT head repeated. Neurology consultation requested. 12/20 CT head: no acute findings 12/21 Worsening renal indices. Nephrology consultation requested. HCO3 infusion initiated for met acidosis with mild hyperkalemia   INDWELLING DEVICES:: ETT 12/18 >>  R IJ CVL 12/19 >>   MICRO DATA: MRSA PCR 12/19 >> NEG C diff 12/19 >> antigen and toxin POS GI panel 12/19 >> NEG Urine 12/20 >> multiple species Resp 12/19 >> multiple species c/w NOF Blood 12/19 >> NEG Urine 12/22 >> 60 k yeast  ANTIMICROBIALS:   Vanc (enteral) 12/19 >>  Metronidazole 12/20 >> 12/24 Cefepime 12/20 >> 12/24   SUBJECTIVE:  RASS -5 off of all sedation  VITAL SIGNS: BP (!) 171/65   Pulse 73   Temp (!) 95.9 F (35.5 C)   Resp 18   Ht 5' 4"  (1.626 m)   Wt 75.5 kg (166 lb 7.2 oz)   SpO2 96%   BMI 28.57 kg/m   HEMODYNAMICS:    VENTILATOR SETTINGS: Vent Mode: PRVC FiO2 (%):  [30 %] 30 % Set Rate:  [14 bmp] 14 bmp Vt Set:  [400 mL]  400 mL PEEP:  [5 cmH20] 5 cmH20 Plateau Pressure:  [19 cmH20-22 cmH20] 22 cmH20  INTAKE / OUTPUT: I/O last 3 completed shifts: In: 9678 [I.V.:3503; NG/GT:740; IV Piggyback:200] Out: 1745 [Urine:1745]  PHYSICAL EXAMINATION: General: Intubated, RASS -5 off of all sedation Neuro: Pupils 2-3 mm and symmetric, no spontaneous movement, no withdrawal, no hyperreflexia HEENT: NCAT, sclerae white Cardiovascular: Regular, no M Lungs: Scattered rhonchi, no wheezes Abdomen: Soft, bowel sounds present Extremities: Warm, no edema Skin: No lesions noted  LABS:  BMET Recent Labs  Lab 05/31/17 1804 06/01/17 0438 06/02/17 0449  NA 134* 136 137  K 3.7 3.5 3.0*  CL 101 100* 94*  CO2 24 26 34*  BUN 48* 51* 56*  CREATININE 3.92* 3.88* 3.64*  GLUCOSE 251* 254* 267*    Electrolytes Recent Labs  Lab 05/31/17 0549  05/31/17 1804 06/01/17 0438 06/02/17 0449  CALCIUM 7.1*  7.1*   < > 6.8* 7.2* 6.9*  MG 1.3*  --   --  1.2* 1.7  PHOS 4.4  --   --  4.6 4.1   < > = values in this interval not displayed.    CBC Recent Labs  Lab 05/31/17 0647 06/01/17 0438 06/02/17 0449  WBC 10.9 9.4 7.9  HGB 7.7* 8.6* 8.7*  HCT 23.6* 25.8* 25.9*  PLT 207 217 235    Coag's Recent Labs  Lab 05/28/17 0307 05/28/17 0854 05/31/17 0119  APTT 44* 40* 53*  INR 1.10 1.15 1.27  Sepsis Markers Recent Labs  Lab 05/28/17 0307 05/28/17 0308 05/29/17 0416 05/30/17 0406 06/01/17 0438 06/01/17 0830  LATICACIDVEN  --  0.7  --   --  1.6 1.3  PROCALCITON 15.05  --  69.86 46.04  --   --     ABG Recent Labs  Lab 05/22/2017 2319 05/28/17 0408 06/01/17 0522  PHART 7.37 7.53* 7.47*  PCO2ART 33 28* 41  PO2ART 63* 123* 88    Liver Enzymes Recent Labs  Lab 05/30/17 1316 05/31/17 0549 06/01/17 0438 06/02/17 0449  AST 36  --  54* 56*  ALT 12*  --  13* 11*  ALKPHOS 73  --  100 100  BILITOT 0.4  --  0.4 0.4  ALBUMIN 1.6* 1.8* 1.8* 1.8*    Cardiac Enzymes Recent Labs  Lab 05/28/17 0643  05/28/17 1256 05/28/17 1756  TROPONINI 0.05* 0.04* 0.04*    Glucose Recent Labs  Lab 06/01/17 1947 06/01/17 2355 06/02/17 0459 06/02/17 0606 06/02/17 0739 06/02/17 1128  GLUCAP 305* 326* 258* 278* 225* 184*    CXR: Bilateral infiltrate, atelectasis and/or effusion R > L     ASSESSMENT / PLAN:  PULMONARY A: Acute respiratory failure -intubated after cardiac arrest Bilateral pleural effusions, atelectasis P:   Cont full vent support - settings reviewed and/or adjusted Cont vent bundle Daily SBT if/when meets criteria   CARDIOVASCULAR A:  Recent CABG Cardiac arrest Hemodynamically stable P:  Monitor BP and rhythm  RENAL A:   AKI, nonoliguric -creatinine improving slowly CKD -baseline creatinine 2.3 Hyperkalemia, resolved Metabolic acidosis, resolved Metabolic alkalosis, iatrogenic P:   Monitor BMET intermittently Monitor I/Os Correct electrolytes as indicated Discontinue bicarbonate infusion well/24   GASTROINTESTINAL A:   Diarrhea, resolved P:   SUP: enteral famotidine Cont TF protocol DC rectal tube  HEMATOLOGIC A:   ICU acquired anemia P:  DVT px: SQ heparin Monitor CBC intermittently Transfuse per usual guidelines   INFECTIOUS A:   C diff Possible PNA, treated P:   Monitor temp, WBC count Micro and abx as above   ENDOCRINE A:   DM 2 Hyperglycemia P:   Initiate Lantus 12/24 Cont SSI - Mod scale  NEUROLOGIC A:   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

## 2017-06-02 NOTE — Progress Notes (Addendum)
[place] for electrolyte management  Indication: hypokalemia   Pharmacy consulted for electrolyte management for 61 yo female admitted post PEA arrest and s/p targeted temperature management. Patient currently requiring mechanical ventilation. Patient ordered potassium 30mEq IV Q1hr x 4. Patients goal potassium is 4 and goal magnesium is 2.   Plan:  Will order additional potassium 54mEq VT x 1. Will order magnesium 2g IV x 1. Corrected calcium is 8.5. Will recheck potassium at 1800 and all other electrolytes with am labs.    Allergies  Allergen Reactions  . Meperidine Nausea And Vomiting       . Oxycodone-Acetaminophen Nausea And Vomiting         Patient Measurements: Height: 5\' 4"  (162.6 cm) Weight: 166 lb 7.2 oz (75.5 kg) IBW/kg (Calculated) : 54.7  Vital Signs: Temp: 95.7 F (35.4 C) (12/24 0700) Temp Source: Bladder (12/23 2000) BP: 153/65 (12/24 0600) Pulse Rate: 67 (12/24 0700) Intake/Output from previous day: 12/23 0701 - 12/24 0700 In: 2843 [I.V.:2303; NG/GT:440; IV Piggyback:100] Out: 1125 [Urine:1125] Intake/Output from this shift: No intake/output data recorded.  Labs: Recent Labs    05/30/17 1316  05/31/17 0119 05/31/17 0549 05/31/17 0647  05/31/17 1804 06/01/17 0438 06/02/17 0449  WBC  --   --   --   --  10.9  --   --  9.4 7.9  HGB 7.0*   < >  --   --  7.7*  --   --  8.6* 8.7*  HCT 21.5*   < >  --   --  23.6*  --   --  25.8* 25.9*  PLT  --   --   --   --  207  --   --  217 235  APTT  --   --  53*  --   --   --   --   --   --   CREATININE 3.25*   < > 3.83* 3.89*  3.85*  --    < > 3.92* 3.88* 3.64*  MG  --   --   --  1.3*  --   --   --  1.2* 1.7  PHOS  --   --   --  4.4  --   --   --  4.6 4.1  ALBUMIN 1.6*  --   --  1.8*  --   --   --  1.8* 1.8*  PROT 4.5*  --   --   --   --   --   --  5.2* 5.2*  AST 36  --   --   --   --   --   --  54* 56*  ALT 12*  --   --   --   --   --   --  13* 11*  ALKPHOS 73  --    --   --   --   --   --  100 100  BILITOT 0.4  --   --   --   --   --   --  0.4 0.4   < > = values in this interval not displayed.   Estimated Creatinine Clearance: 16.3 mL/min (A) (by C-G formula based on SCr of 3.64 mg/dL (H)).   Medical History: Past Medical History:  Diagnosis Date  . Anxiety state 01/22/2011  . Combined forms of age-related cataract of both eyes 03/30/2014  . Diabetes mellitus without complication ([place])   .  HTN (hypertension) 08/30/2013  . Hypertension   . Irritable bowel syndrome 01/22/2011  . Microalbuminuria 08/15/2015  . Polyneuropathy associated with underlying disease ([place]) 08/15/2015   Overview:  Gabapentin qhs  . Type 2 diabetes mellitus not at goal [facility]) 08/15/2015   Overview:  Can no longer go to [facility]. Referred to [facility] 3/17   Pharmacy will continue to monitor and adjust per consult.   [place] Rollyson L 06/02/2017,7:54 AM

## 2017-06-02 NOTE — Progress Notes (Signed)
Pt was placed on the transport vent to transport to MRI. She was then placed on the MRI compatible vent. After the procedure she was placed back on the transport vent to go back to CCU.

## 2017-06-02 NOTE — Progress Notes (Addendum)
Pt has remained unresponsive. MR brain completed today showing global anoxic injury. Daughters and mother have been updated at bedside. Pt has remained in NSR, BP WNL. Pt has remained ventilated on 30% FiO2. Temp foley removed d/t MR scan-external cath placed.

## 2017-06-03 LAB — GLUCOSE, CAPILLARY
GLUCOSE-CAPILLARY: 116 mg/dL — AB (ref 65–99)
GLUCOSE-CAPILLARY: 151 mg/dL — AB (ref 65–99)

## 2017-06-03 LAB — CALCIUM, IONIZED
CALCIUM, IONIZED, SERUM: 4.1 mg/dL — AB (ref 4.5–5.6)
Calcium, Ionized, Serum: 4.2 mg/dL — ABNORMAL LOW (ref 4.5–5.6)

## 2017-06-03 NOTE — Progress Notes (Signed)
PULMONARY / CRITICAL CARE MEDICINE   Name: Kim Stevens MRN: [id number] DOB: March 26, 1956    ADMISSION DATE:  05/17/2017  PT PROFILE:   63 F underwent CABG 11/19 in @ [facility]. Hospitalization c/b cardiac arrest that was attributed to respiratory failure. Pt was ultimately transferred to a Rehab center. Was found by staff unresponsive and pulseless. EMS dispatched and initial rhythm was reportedly PEA. Underwent TTM protocol. Diarrhea noted and C diff PCR positive (toxin and antigen)  MAJOR EVENTS/TEST RESULTS: 12/18 admitted after OOH cardiac arrest. TTM protocol 12/18 CTA chest: no PE. Cardiomegaly with post CABG changes.  Trace pericardial fluid. Small pleural effusions. Partial consolidations in the lower lobes, may reflect atelectasis, or mild pneumonia  12/19 CT head: no acute findings 12/19 Cardiology consultation 12/19 Echocardiogram: LVEF 50-55%.  Grade 1 diastolic dysfunction.  Mild MR. Moderate TR. PAP normal.  12/20 Rewarmed. Unresponsive off of sedatives. CT head repeated. Neurology consultation requested. 12/20 CT head: no acute findings 12/21 Worsening renal indices. Nephrology consultation requested. HCO3 infusion initiated for met acidosis with mild hyperkalemia 12/24 MRI brain: Diffuse cortical, bilateral thalamic, and bilateral hippocampus injury consistent with global anoxic injury 12/24 MRI findings discussed with pt's daughters. Pt made DNR. Plan discontinuation of vent support 12/26  INDWELLING DEVICES:: ETT 12/18 >>  R IJ CVL 12/19 >>   MICRO DATA: MRSA PCR 12/19 >> NEG C diff 12/19 >> antigen and toxin POS GI panel 12/19 >> NEG Urine 12/20 >> multiple species Resp 12/19 >> multiple species c/w NOF Blood 12/19 >> NEG Urine 12/22 >> 60 k yeast  ANTIMICROBIALS:   Vanc (enteral) 12/19 >> 12/25 Metronidazole 12/20 >> 12/24 Cefepime 12/20 >> 12/24   SUBJECTIVE:  Comatose  VITAL SIGNS: BP 135/62   Pulse 70   Temp (!) 97.5 F (36.4 C) (Oral)   Resp  15   Ht _0  (1.626 m)   Wt 75.5 kg (166 lb 7.2 oz)   SpO2 100%   BMI 28.57 kg/m   HEMODYNAMICS:    VENTILATOR SETTINGS: Vent Mode: PRVC FiO2 (%):  [30 %] 30 % Set Rate:  [14 bmp-15 bmp] 14 bmp Vt Set:  [400 mL] 400 mL PEEP:  [5 cmH20] 5 cmH20  INTAKE / OUTPUT: I/O last 3 completed shifts: In: 2513.7 [I.V.:1003; NG/GT:1260.7; IV Piggyback:250] Out: 775 [Urine:775]  PHYSICAL EXAMINATION: General: Intubated, RASS -5 off of all sedation Neuro: Pupils 2-3 mm and symmetric, no spontaneous movement, no withdrawal, no hyperreflexia HEENT: NCAT, sclerae white Cardiovascular: Regular, no M Lungs: Scattered rhonchi, no wheezes Abdomen: Soft, bowel sounds present Extremities: Warm, no edema Skin: No lesions noted  LABS:  BMET Recent Labs  Lab 05/31/17 1804 06/01/17 0438 06/02/17 0449  NA 134* 136 137  K 3.7 3.5 3.0*  CL 101 100* 94*  CO2 24 26 34*  BUN 48* 51* 56*  CREATININE 3.92* 3.88* 3.64*  GLUCOSE 251* 254* 267*    Electrolytes Recent Labs  Lab 05/31/17 0549  05/31/17 1804 06/01/17 0438 06/02/17 0449  CALCIUM 7.1*  7.1*   < > 6.8* 7.2* 6.9*  MG 1.3*  --   --  1.2* 1.7  PHOS 4.4  --   --  4.6 4.1   < > = values in this interval not displayed.    CBC Recent Labs  Lab 05/31/17 0647 06/01/17 0438 06/02/17 0449  WBC 10.9 9.4 7.9  HGB 7.7* 8.6* 8.7*  HCT 23.6* 25.8* 25.9*  PLT 207 217 235    Coag's Recent Labs  Lab 05/28/17 0307 05/28/17 0854 05/31/17 0119  APTT 44* 40* 53*  INR 1.10 1.15 1.27    Sepsis Markers Recent Labs  Lab 05/28/17 0307 05/28/17 0308 05/29/17 0416 05/30/17 0406 06/01/17 0438 06/01/17 0830  LATICACIDVEN  --  0.7  --   --  1.6 1.3  PROCALCITON 15.05  --  69.86 46.04  --   --     ABG Recent Labs  Lab 06/06/2017 2319 05/28/17 0408 06/01/17 0522  PHART 7.37 7.53* 7.47*  PCO2ART 33 28* 41  PO2ART 63* 123* 88    Liver Enzymes Recent Labs  Lab 05/30/17 1316 05/31/17 0549 06/01/17 0438 06/02/17 0449   AST 36  --  54* 56*  ALT 12*  --  13* 11*  ALKPHOS 73  --  100 100  BILITOT 0.4  --  0.4 0.4  ALBUMIN 1.6* 1.8* 1.8* 1.8*    Cardiac Enzymes Recent Labs  Lab 05/28/17 0643 05/28/17 1256 05/28/17 1756  TROPONINI 0.05* 0.04* 0.04*    Glucose Recent Labs  Lab 06/02/17 1128 06/02/17 1630 06/02/17 1939 06/02/17 2348 06/03/17 0351 06/03/17 0741  GLUCAP 184* 122* 123* 125* 116* 151*    CXR: Bilateral infiltrate, atelectasis and/or effusion R > L     ASSESSMENT / PLAN: S/P cardiac arrest Acute respiratory failure -intubated after cardiac arrest Bilateral pleural effusions, atelectasis Recent CABG AKI, nonoliguric CKD Hyperkalemia, resolved Metabolic acidosis, resolved Metabolic alkalosis, iatrogenic Diarrhea, resolved ICU acquired anemia C diff Possible PNA, treated DM 2 Hyperglycemia Severe anoxic encephalopathy P:   Cont full vent support for now Discontinue all other therapies Plan terminal extubation 12/26    FAMILY  - Updates: Daughter updated @ bedside  Merton Border, MD PCCM service Mobile [phone] Pager [phone] 06/03/2017, 1:19 PM

## 2017-06-03 NOTE — Progress Notes (Signed)
[facility] [place] NAME: Myleigh Amara    MR#:  [id number]  DATE OF BIRTH:  [birth year]  SUBJECTIVE:  CHIEF COMPLAINT:   Chief Complaint  Patient presents with  . Cardiac Arrest   - cardiac arrest, recent CABG 4 weeks ago - Off sedation for more than 3 days, no significant change in mental status. MRI with significant global anoxic injury changes.  REVIEW OF SYSTEMS:  Review of Systems  Unable to perform ROS: Critical illness  Intubated and sedated  DRUG ALLERGIES:   Allergies  Allergen Reactions  . Meperidine Nausea And Vomiting       . Oxycodone-Acetaminophen Nausea And Vomiting         VITALS:  Blood pressure (!) 123/57, pulse 72, temperature 97.7 F (36.5 C), temperature source Axillary, resp. rate 14, height 5\' 4"  (1.626 m), weight 75.5 kg (166 lb 7.2 oz), SpO2 98 %.  PHYSICAL EXAMINATION:  Physical Exam  GENERAL:  61 y.o.-year-old patient lying in the bed, critically ill appearing.  EYES: Pupils pinpoint with no reaction to light. No scleral icterus.  HEENT: Head atraumatic, normocephalic. Oropharynx and nasopharynx clear.  NECK:  Supple, no jugular venous distention. No thyroid enlargement, no tenderness.  LUNGS: Normal breath sounds bilaterally, no wheezing, rales,rhonchi or crepitation. No use of accessory muscles of respiration. Decreased at the bases CARDIOVASCULAR: S1, S2 normal. No murmurs, rubs, or gallops.  CABG incision with gaping noted- dressing in place- no purulence noted. Superficial midline sternal incision dehiscence present. ABDOMEN: Soft, nontender, nondistended. Hypoactive Bowel sounds present. No organomegaly or mass.  EXTREMITIES: No pedal edema, cyanosis, or clubbing.  NEUROLOGIC: not responding currently. No withdrawal to tactile stimulus or even pain. No posturing noted. PSYCHIATRIC: The patient is obtunded.  SKIN: No obvious rash, lesion, or ulcer.    LABORATORY PANEL:   CBC Recent Labs    Lab 06/02/17 0449  WBC 7.9  HGB 8.7*  HCT 25.9*  PLT 235   ------------------------------------------------------------------------------------------------------------------  Chemistries  Recent Labs  Lab 06/02/17 0449  NA 137  K 3.0*  CL 94*  CO2 34*  GLUCOSE 267*  BUN 56*  CREATININE 3.64*  CALCIUM 6.9*  MG 1.7  AST 56*  ALT 11*  ALKPHOS 100  BILITOT 0.4   ------------------------------------------------------------------------------------------------------------------  Cardiac Enzymes Recent Labs  Lab 05/28/17 1756  TROPONINI 0.04*   ------------------------------------------------------------------------------------------------------------------  RADIOLOGY:  Mr Brain Wo Contrast  Result Date: 06/02/2017 CLINICAL DATA:  Altered mental status.  Cardiac arrest. EXAM: MRI HEAD WITHOUT CONTRAST TECHNIQUE: Multiplanar, multiecho pulse sequences of the brain and surrounding structures were obtained without intravenous contrast. COMPARISON:  Head CT from 4 days ago. FINDINGS: Brain: Most convincing on ADC map, there is a degree of diffuse cortical altered diffusion, with edematous appearance on FLAIR imaging. Both hippocampi restrict diffusion and have an expanded edematous appearance. Symmetric central thalamic injury. The cerebellum appears symmetrically T2 hyperintense with folia effacement. No hemorrhage, hydrocephalus, or shift. Vascular: Major flow voids are preserved. Skull and upper cervical spine: Negative for marrow lesion. Sinuses/Orbits: Bilateral cataract resection. Moderate sinusitis and right more than left mastoid opacification in the setting of nasopharyngeal fluid in intubation. IMPRESSION: Diffuse cortical, bilateral thalamic, and bilateral hippocampus injury consistent with global anoxic injury. Electronically Signed   By: Monte Fantasia M.D.   On: 06/02/2017 14:19   Dg Chest Port 1 View  Result Date: 06/02/2017 CLINICAL DATA:  Hypoxia EXAM: PORTABLE  CHEST 1 VIEW COMPARISON:  June 01, 2017 FINDINGS:  Endotracheal tube tip is 2.0 cm above the carina. Nasogastric tube tip and side port are below the diaphragm. Central catheter tip is in the right atrium slightly beyond the cavoatrial junction. No pneumothorax. There are pleural effusions bilaterally with bibasilar consolidation. There is cardiomegaly with pulmonary vascularity within normal limits. There is aortic atherosclerosis. Patient is status post coronary artery bypass grafting. No bone lesions. IMPRESSION: Tube and catheter positions as described without pneumothorax. There is cardiomegaly with bilateral pleural effusions and bibasilar consolidation. Question alveolar edema versus pneumonia in the bases. Both entities may exist concurrently. There is aortic atherosclerosis. Aortic Atherosclerosis (ICD10-I70.0). Electronically Signed   By: Lowella Grip III M.D.   On: 06/02/2017 07:12    EKG:   Orders placed or performed during the hospital encounter of 05/21/2017  . EKG 12-Lead  . EKG 12-Lead  . EKG 12-Lead  . EKG 12-Lead  . EKG 12-Lead  . EKG 12-Lead    ASSESSMENT AND PLAN:   61 year old female with past medical history significant for idiopathic cardiomyopathy with EF of 45%, hypertension, DM and CKD who had CABG at [facility] 4 weeks ago was brought in from rehab due to cardiac arrest.  1. Cardiac arrest-  ? Sepsis vs respiratory cause likely - CT chest negative for PE - appreciate cardiology and intensivist consult - ECHO done, less likely to be cardiac per cardiology - hypothermia protocol finished > 72 hrs and no improvement in mental status -Remains intubated  2. Sepsis- off  levophed, elevated procalcitonin - stool for c.diff positive, no pneumonia noted- off cefepime for atelectasis and UTI. Urine appears contaminated - on oral vancomycin   for C. difficile colitis  3. ARF- likely from ATN- with cardiac arrest, sepsis - creatinine at discharge was  1.5  last week, now worsening, cr at 3.6- but urine output has improved. - nephrology consult.  -on bicarbonate drip -No acute indication for dialysis. Making minimal amount of urine. Continue to monitor.  4. Anemia of chronic disease and also from recent surgery- hb at discharge was around 7.5 last week, Steady drop noted after admission, Received one unit transfusion  - hb has been stable at 8.7   5. Acute encephalopathy-anoxic encephalopathy. -CT head without any acute findings. Off sedation for more than 4 days. -Appreciate neurology consult. Poor prognosis. MRI of the brain with global anoxic injury changes..  6. DVT Prophylaxis- heparin SQ  7. Hypokalemia and hypomagnesemia-Replaced appropriately   Critically ill, overall poor prognosis. Family wants to continue care on Christmas- family meeting for possible withdrawal of care after Christmas Updated Mother at bedside   All the records are reviewed and case discussed with Care Management/Social Workerr. Management plans discussed with the patient, family and they are in agreement.  CODE STATUS: Full Code  TOTAL TIME TAKING CARE OF THIS PATIENT: 28 minutes.   POSSIBLE D/C IN ? DAYS, DEPENDING ON CLINICAL CONDITION.   Felecia Stanfill M.D on 06/03/2017 at 8:30 AM  Between 7am to 6pm - Pager - [phone]  After 6pm go to [email or web address] - password [email or web address] [facility]  Office  [phone]  CC: Primary care physician; Rudi Coco, MD (Inactive)

## 2017-06-03 NOTE — Progress Notes (Signed)
[facility]  ROUNDING NOTE   Subjective:   MRI with global anoxic brain injury.   No new labs this AM No family at bedside currently.   Objective:  Vital signs in last 24 hours:  Temp:  [95.9 F (35.5 C)-97.7 F (36.5 C)] 97.5 F (36.4 C) (12/25 0800) Pulse Rate:  [65-81] 68 (12/25 1000) Resp:  [0-23] 0 (12/25 1000) BP: (105-188)/(53-85) 105/55 (12/25 1000) SpO2:  [96 %-100 %] 100 % (12/25 1000) FiO2 (%):  [30 %] 30 % (12/25 0758)  Weight change:  Filed Weights   05/31/17 0251 06/01/17 0500 06/02/17 0500  Weight: 72.3 kg (159 lb 6.3 oz) 74.2 kg (163 lb 9.3 oz) 75.5 kg (166 lb 7.2 oz)    Intake/Output: I/O last 3 completed shifts: In: 2513.7 [I.V.:1003; NG/GT:1260.7; IV Piggyback:250] Out: 449 [Urine:775]   Intake/Output this shift:  Total I/O In: 120 [NG/GT:120] Out: -   Physical Exam: General: Critically ill  Head: ETT  Eyes: Anicteric, PERRL  Neck: Supple, trachea midline  Lungs:  PRVC FiO2 30%  Heart: Regular rate and rhythm  Abdomen:  Soft, nontender  Extremities: no peripheral edema.  Neurologic: Intubated, off sedation, not responding to stimuli  Skin: No lesions       Basic Metabolic Panel: Recent Labs  Lab 05/31/17 0549 05/31/17 0952 05/31/17 1440 05/31/17 1804 06/01/17 0438 06/02/17 0449  NA 134*  134* 133* 133* 134* 136 137  K 4.0  3.9 3.8 3.6 3.7 3.5 3.0*  CL 105  104 103 101 101 100* 94*  CO2 21*  22 23 26 24 26  34*  GLUCOSE 178*  182* 184* 221* 251* 254* 267*  BUN 42*  41* 41* 46* 48* 51* 56*  CREATININE 3.89*  3.85* 3.87* 4.03* 3.92* 3.88* 3.64*  CALCIUM 7.1*  7.1* 7.1* 6.8* 6.8* 7.2* 6.9*  MG 1.3*  --   --   --  1.2* 1.7  PHOS 4.4  --   --   --  4.6 4.1    Liver Function Tests: Recent Labs  Lab 06/07/2017 2220 05/30/17 1316 05/31/17 0549 06/01/17 0438 06/02/17 0449  AST 54* 36  --  54* 56*  ALT 26 12*  --  13* 11*  ALKPHOS 95 73  --  100 100  BILITOT 0.4 0.4  --  0.4 0.4  PROT 6.5 4.5*  --  5.2*  5.2*  ALBUMIN 2.6* 1.6* 1.8* 1.8* 1.8*   No results for input(s): LIPASE, AMYLASE in the last 168 hours. No results for input(s): AMMONIA in the last 168 hours.  CBC: Recent Labs  Lab 05/19/2017 2220  05/29/17 0416 05/30/17 0406 05/30/17 1316 05/30/17 2337 05/31/17 0647 06/01/17 0438 06/02/17 0449  WBC 10.2   < > 10.4 12.4*  --   --  10.9 9.4 7.9  NEUTROABS 7.3*  --   --   --   --   --   --  7.5* 6.2  HGB 8.1*   < > 8.2* 7.4* 7.0* 6.6* 7.7* 8.6* 8.7*  HCT 25.5*   < > 25.0* 23.5* 21.5* 20.0* 23.6* 25.8* 25.9*  MCV 93.2   < > 89.4 90.9  --   --  88.6 88.1 87.1  PLT 232   < > 245 266  --   --  207 217 235   < > = values in this interval not displayed.    Cardiac Enzymes: Recent Labs  Lab 06/05/2017 2220 05/28/17 0307 05/28/17 0643 05/28/17 1256 05/28/17 1756  TROPONINI 0.03*  0.06* 0.05* 0.04* 0.04*    BNP: Invalid input(s): POCBNP  CBG: Recent Labs  Lab 06/02/17 1630 06/02/17 1939 06/02/17 2348 06/03/17 0351 06/03/17 0741  GLUCAP 122* 123* 125* 116* 151*    Microbiology: Results for orders placed or performed during the hospital encounter of 05/25/2017  C difficile quick scan w PCR reflex     Status: Abnormal   Collection Time: 05/28/17 12:03 AM  Result Value Ref Range Status   C Diff antigen POSITIVE (A) NEGATIVE Final   C Diff toxin POSITIVE (A) NEGATIVE Final   C Diff interpretation Toxin producing C. difficile detected.  Final    Comment: CRITICAL RESULT CALLED TO, READ BACK BY AND VERIFIED WITH: Tia Masker AT 0142 05/28/17 ALV   Gastrointestinal Panel by PCR , Stool     Status: None   Collection Time: 05/28/17 12:03 AM  Result Value Ref Range Status   Campylobacter species NOT DETECTED NOT DETECTED Final   Plesimonas shigelloides NOT DETECTED NOT DETECTED Final   Salmonella species NOT DETECTED NOT DETECTED Final   Yersinia enterocolitica NOT DETECTED NOT DETECTED Final   Vibrio species NOT DETECTED NOT DETECTED Final   Vibrio cholerae NOT  DETECTED NOT DETECTED Final   Enteroaggregative E coli (EAEC) NOT DETECTED NOT DETECTED Final   Enteropathogenic E coli (EPEC) NOT DETECTED NOT DETECTED Final   Enterotoxigenic E coli (ETEC) NOT DETECTED NOT DETECTED Final   Shiga like toxin producing E coli (STEC) NOT DETECTED NOT DETECTED Final   Shigella/Enteroinvasive E coli (EIEC) NOT DETECTED NOT DETECTED Final   Cryptosporidium NOT DETECTED NOT DETECTED Final   Cyclospora cayetanensis NOT DETECTED NOT DETECTED Final   Entamoeba histolytica NOT DETECTED NOT DETECTED Final   Giardia lamblia NOT DETECTED NOT DETECTED Final   Adenovirus F40/41 NOT DETECTED NOT DETECTED Final   Astrovirus NOT DETECTED NOT DETECTED Final   Norovirus GI/GII NOT DETECTED NOT DETECTED Final   Rotavirus A NOT DETECTED NOT DETECTED Final   Sapovirus (I, II, IV, and V) NOT DETECTED NOT DETECTED Final  MRSA PCR Screening     Status: None   Collection Time: 05/28/17  3:28 AM  Result Value Ref Range Status   MRSA by PCR NEGATIVE NEGATIVE Final    Comment:        The GeneXpert MRSA Assay (FDA approved for NASAL specimens only), is one component of a comprehensive MRSA colonization surveillance program. It is not intended to diagnose MRSA infection nor to guide or monitor treatment for MRSA infections.   Culture, blood (Routine X 2) w Reflex to ID Panel     Status: None   Collection Time: 05/28/17  3:29 AM  Result Value Ref Range Status   Specimen Description BLOOD RIGHT HAND  Final   Special Requests   Final    BOTTLES DRAWN AEROBIC AND ANAEROBIC Blood Culture adequate volume   Culture   Final    NO GROWTH 5 DAYS Performed at [facility], [address], [place], [place] [address]    Report Status 06/02/2017 FINAL  Final  Culture, blood (Routine X 2) w Reflex to ID Panel     Status: None   Collection Time: 05/28/17  3:35 AM  Result Value Ref Range Status   Specimen Description BLOOD LEFT HAND  Final   Special Requests   Final     BOTTLES DRAWN AEROBIC AND ANAEROBIC Blood Culture adequate volume   Culture   Final    NO GROWTH 5 DAYS Performed at [facility], [address], [place], [place] [address]    Report Status 06/02/2017 FINAL  Final  Culture, respiratory (NON-Expectorated)     Status: None   Collection Time: 05/28/17  6:56 AM  Result Value Ref Range Status   Specimen Description   Final    TRACHEAL ASPIRATE Performed at [facility], [address], [place], [place] [address]    Special Requests   Final    NONE Performed at [facility], [address] [place], [place] [address]    Gram Stain   Final    FEW WBC PRESENT, PREDOMINANTLY PMN MODERATE GRAM POSITIVE COCCI IN CHAINS IN CLUSTERS FEW GRAM NEGATIVE RODS FEW GRAM NEGATIVE COCCOBACILLI FEW GRAM POSITIVE RODS    Culture   Final    Consistent with normal respiratory flora. Performed at [facility], [place] [address], [place], [place] [address]    Report Status 05/30/2017 FINAL  Final  Urine Culture     Status: Abnormal   Collection Time: 05/29/17 12:09 PM  Result Value Ref Range Status   Specimen Description   Final    URINE, CLEAN CATCH Performed at [facility], [address], [place], [place] [address]    Special Requests   Final    NONE Performed at [facility] [address] [place], [place] [address]    Culture MULTIPLE SPECIES PRESENT, SUGGEST RECOLLECTION (A)  Final   Report Status 05/30/2017 FINAL  Final  Urine Culture     Status: Abnormal   Collection Time: 05/31/17 10:50 AM  Result Value Ref Range Status   Specimen Description   Final    URINE, RANDOM Performed at [facility], [address] [place], [place] [address]    Special Requests   Final    NONE Performed at [facility], [place], [place] [address]    Culture 60,000 COLONIES/mL YEAST (A)  Final   Report Status 06/01/2017 FINAL  Final    Coagulation Studies: No  results for input(s): LABPROT, INR in the last 72 hours.  Urinalysis: No results for input(s): COLORURINE, LABSPEC, PHURINE, GLUCOSEU, HGBUR, BILIRUBINUR, KETONESUR, PROTEINUR, UROBILINOGEN, NITRITE, LEUKOCYTESUR in the last 72 hours.  Invalid input(s): APPERANCEUR    Imaging: Mr Brain Wo Contrast  Result Date: 06/02/2017 CLINICAL DATA:  Altered mental status.  Cardiac arrest. EXAM: MRI HEAD WITHOUT CONTRAST TECHNIQUE: Multiplanar, multiecho pulse sequences of the brain and surrounding structures were obtained without intravenous contrast. COMPARISON:  Head CT from 4 days ago. FINDINGS: Brain: Most convincing on ADC map, there is a degree of diffuse cortical altered diffusion, with edematous appearance on FLAIR imaging. Both hippocampi restrict diffusion and have an expanded edematous appearance. Symmetric central thalamic injury. The cerebellum appears symmetrically T2 hyperintense with folia effacement. No hemorrhage, hydrocephalus, or shift. Vascular: Major flow voids are preserved. Skull and upper cervical spine: Negative for marrow lesion. Sinuses/Orbits: Bilateral cataract resection. Moderate sinusitis and right more than left mastoid opacification in the setting of nasopharyngeal fluid in intubation. IMPRESSION: Diffuse cortical, bilateral thalamic, and bilateral hippocampus injury consistent with global anoxic injury. Electronically Signed   By: Monte Fantasia M.D.   On: 06/02/2017 14:19   Dg Chest Port 1 View  Result Date: 06/02/2017 CLINICAL DATA:  Hypoxia EXAM: PORTABLE CHEST 1 VIEW COMPARISON:  June 01, 2017 FINDINGS: Endotracheal tube tip is 2.0 cm above the carina. Nasogastric tube tip and side port are below the diaphragm. Central catheter tip is in the  right atrium slightly beyond the cavoatrial junction. No pneumothorax. There are pleural effusions bilaterally with bibasilar consolidation. There is cardiomegaly with pulmonary vascularity within normal limits. There is aortic  atherosclerosis. Patient is status post coronary artery bypass grafting. No bone lesions. IMPRESSION: Tube and catheter positions as described without pneumothorax. There is cardiomegaly with bilateral pleural effusions and bibasilar consolidation. Question alveolar edema versus pneumonia in the bases. Both entities may exist concurrently. There is aortic atherosclerosis. Aortic Atherosclerosis (ICD10-I70.0). Electronically Signed   By: Lowella Grip III M.D.   On: 06/02/2017 07:12     Medications:   . sodium chloride     . chlorhexidine gluconate (MEDLINE KIT)  15 mL Mouth Rinse BID  . feeding supplement (VITAL AF 1.2 CAL)  1,000 mL Per Tube Q24H  . mouth rinse  15 mL Mouth Rinse 10 times per day   sodium chloride, sodium chloride flush  Assessment/ Plan:  Kim Stevens is a 61 y.o. white female with coronary artery disease status post CABG on 04/2017, hypertension, hyperlipidemia, diabetes mellitus type II insulin dependent, diabetic neuropathy, irritable bowel syndrome,  who was admitted to [facility] on 06/03/2017 for Cardiac arrest ([place]) [I46.9] PEA (Pulseless electrical activity) ([place]) [I46.9]   1. Acute renal failure on chronic kidney disease stage III with proteinuria: baseline creatinine of 1.5, GFR of 35 on 05/22/17.  Chronic Kidney Disease secondary to diabetic nephropathy Acute renal failure from ATN. Complicated by metabolic acidosis and hyponatremia No acute indication for dialysis. Discussed dialysis with patient's family.  Nonoliguric urine output   2. Cardiogenic shock, sepsis, hypovolemia and acute colitis: C. Diff positive - Appreciate cards and critical care input.   3. Respiratory failure: requiring mechanical ventilation.   Discussed case with Critical care.  Will sign off. Please call with questions.    LOS: [place], [place] 12/25/201810:32 AM

## 2017-06-03 NOTE — Progress Notes (Signed)
Pt had not voided in 8 hrs, bladder scanned and scan showed 369cc. MD Simonds notified and gave a verbal order to place a foley catheter for comfort.

## 2017-06-04 DIAGNOSIS — Z7189 Other specified counseling: Secondary | ICD-10-CM

## 2017-06-04 MED ORDER — MORPHINE BOLUS VIA INFUSION: 5.0000 mg | INTRAVENOUS | Status: DC | PRN

## 2017-06-04 MED ORDER — MORPHINE SULFATE (PF) 4 MG/ML IV SOLN
5.0000 mg | INTRAVENOUS | Status: DC | PRN
  Administered 2017-06-04: 5 mg via INTRAVENOUS
  Filled 2017-06-04: qty 2

## 2017-06-10 NOTE — Discharge Summary (Signed)
DEATH SUMMARY  DATE OF ADMISSION:  2017/06/22  DATE OF DISCHARGE/DEATH:  2017-06-30  ADMISSION DIAGNOSES:   Cardiac arrest AKI Hypertension Type 2 diabetes   DISCHARGE DIAGNOSES:   S/P cardiac arrest Acute respiratory failure -intubated after cardiac arrest Bilateral pleural effusions, atelectasis Recent CABG AKI, nonoliguric CKD Hyperkalemia, resolved Metabolic acidosis, resolved Metabolic alkalosis, iatrogenic Diarrhea, resolved ICU acquired anemia C diff Possible PNA, treated DM 2 Hyperglycemia Severe anoxic encephalopathy     PRESENTATION:   Pt was admitted with the following HPI and the above admission diagnoses:  Kim Stevens  is a 62 y.o. female who presents with PEA arrest.  Patient had CABG procedure done at the end of last month, and subsequent to her procedure during that same hospitalization had a cardiac arrest.  She was resuscitated and recovered and was eventually discharged to rehab.  Today she was found late in the evening around 9:00 or just after by staff at the facility unresponsive.  She was pulseless and CPR was initiated.  EMS arrived and 3 rounds of CPR and epinephrine were administered and ROSC was achieved.  Patient arrived here to the ED and had dense neurologic deficits.  She was intubated without requirement for any sedation.  However, family states that after her last arrest she reacted similarly and remained heavily unresponsive for couple of days and then awoke and recovered.  Cardiology was contacted by ED physician and they recommended against emergent catheterization.  Patient's blood pressure was initially low, but quickly corrected without need for pressors.  Intensivist was contacted and hospitalist were called for admission to ICU.  HOSPITAL COURSE:   MAJOR EVENTS/TEST RESULTS: 2023-06-23 admitted after OOH cardiac arrest. TTM (33 degrees) protocol 06/23/23 CTA chest: no PE. Cardiomegaly with post CABG changes. Trace pericardial fluid. Small pleural  effusions. Partial consolidations in the lower lobes, may reflect atelectasis, or mild pneumonia  12/19 CT head: no acute findings 12/19 Cardiology consultation: Cardiac arrest thought to be due to to primary respiratory arrest 12/19 Echocardiogram: LVEF 50-55%.  Grade 1 diastolic dysfunction.  Mild MR. Moderate TR. PAP normal.  12/20 Rewarmed. Unresponsive off of sedatives. CT head repeated. Neurology consultation requested. 12/20 CT head: no acute findings 12/21 Worsening renal indices. Nephrology consultation requested. HCO3 infusion initiated for met acidosis with mild hyperkalemia 12/24 MRI brain: Diffuse cortical, bilateral thalamic, and bilateral hippocampus injury consistent with global anoxic injury 12/24 MRI findings discussed with pt's daughters. Pt made DNR. Plan discontinuation of vent support 07/01/23 2023-07-01 terminal extubation   Cause of death:  PEA cardiac arrest of unclear etiology  Contributing factors: Severe and anoxic brain injury C difficile colitis AKI/CKD CAD DM2  Autopsy: No  Smoking: No   Merton Border, MD PCCM service Mobile [phone] Pager [phone] 06-30-17 1:56 PM

## 2017-06-10 NOTE — Progress Notes (Signed)
Asystole per cardiac monitor.  This RN and Romie Minus, RN went to bedside and auscultated for heart tones and no heart tones noted.  No rise and fall of chest.  Cardiac time of death 1334.  Husband at bedside.  RN offered support.

## 2017-06-10 NOTE — Progress Notes (Signed)
Patient extubated to comfort care by Amy, RRT at 1307.  This RN and Marshall Cork, RN at bedside with patient's husband.  Morphine given for agonal respirations. Will continue to monitor.

## 2017-06-10 NOTE — Progress Notes (Signed)
Pt was suctioned prior to extubation for a moderate amount of white secretions. Per Dr. Alva Garnet order, she was extubated and not to be reintubated.

## 2017-06-10 NOTE — Progress Notes (Signed)
Family is ready to proceed with terminal extubation. I spoke with husband, 3 daughters and son-in-law explaining the process. Withdrawal protocol ordered  Merton Border, MD PCCM service Mobile [phone] Pager [phone] June 13, 2017 11:04 AM

## 2017-06-10 NOTE — Progress Notes (Signed)
RN notified Dr. Alva Garnet and CDS of patient's cardiac time of death of 2.

## 2017-06-10 NOTE — Progress Notes (Signed)
RN notified Webb Silversmith, RN-AC of patient's death.

## 2017-06-10 NOTE — Progress Notes (Signed)
[facility] Called for referral and family asked about being a potential organ donor. RN spoke with Marybelle Killings at [facility] referral number [phone]

## 2017-06-10 NOTE — Progress Notes (Signed)
Nutrition Brief Note  Chart reviewed. Patient now transitioning to comfort care.   No further nutrition interventions warranted at this time. Please consult RD as needed.   Willey Blade, MS, [place], LDN Office: [phone] Pager: [phone] After Hours/Weekend Pager: [phone]

## 2017-06-10 DEATH — deceased

## 2017-06-17 ENCOUNTER — Telehealth: Payer: Self-pay

## 2017-06-17 NOTE — Telephone Encounter (Signed)
Recieved Death Certificate from ___[employer]_______ Delivered/Placed ____in nurses bin________

## 2017-06-17 NOTE — Telephone Encounter (Signed)
Placed in death cert in [facility]' folder to be signed.

## 2017-06-18 NOTE — Telephone Encounter (Signed)
[facility] home informed death is ready for pick up.

## 2018-02-16 IMAGING — CT CT HEAD W/O CM
4 series · 16 of 47 positions shown, 18 images · non-contrast
Comparison: None.

CLINICAL DATA: Unresponsive. Altered mental status. Status post
recent CABG.

EXAM:
CT HEAD WITHOUT CONTRAST
TECHNIQUE: Contiguous axial images were obtained from the base of the skull
through the vertex without intravenous contrast.

[Series 2: head bone · axial · 0.43mm/px · z∈[-69,-41]mm · 3 of 70 slices shown]
[im 7/70  bone]
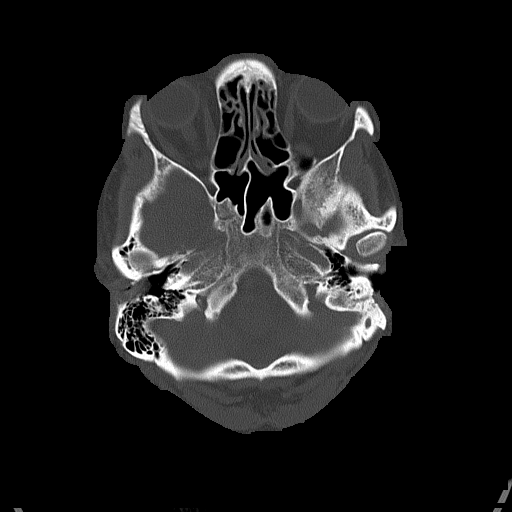
[im 14/70  bone]
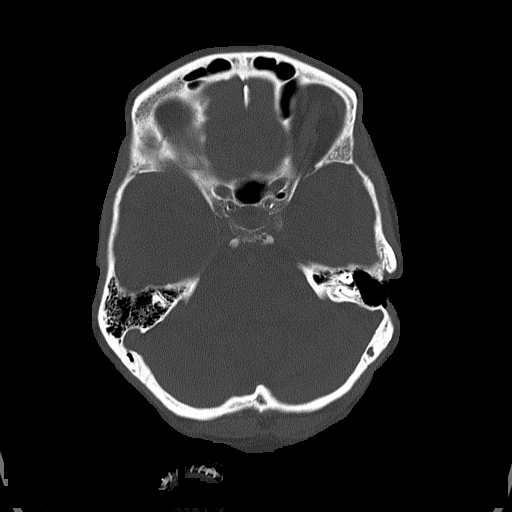
[im 21/70  bone]
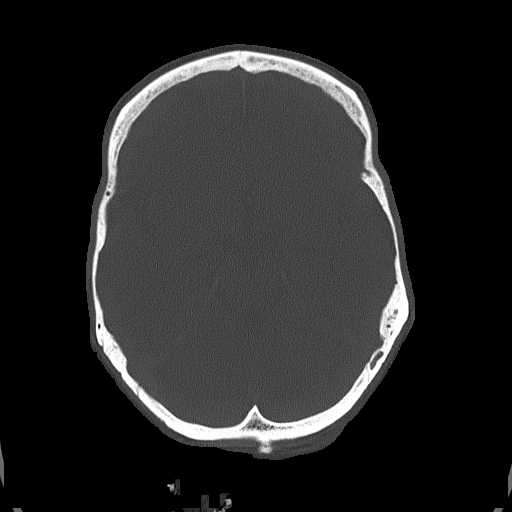

[Series 3: head wo · axial · 0.43mm/px · z∈[-66,+34]mm · 7 of 28 slices shown, 9 images]
[im 4/28  brain]
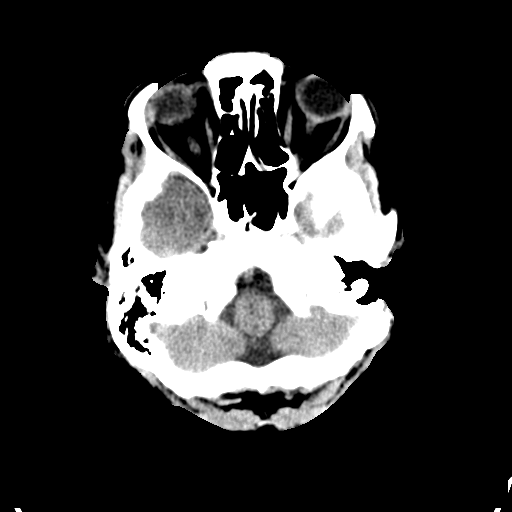
[im 4/28  bone]
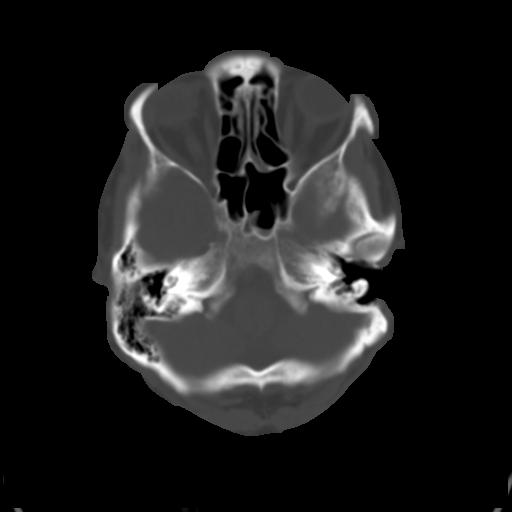
[im 7/28  brain]
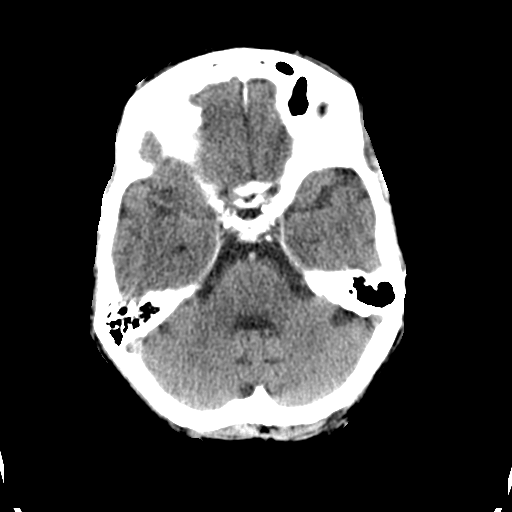
[im 11/28  brain]
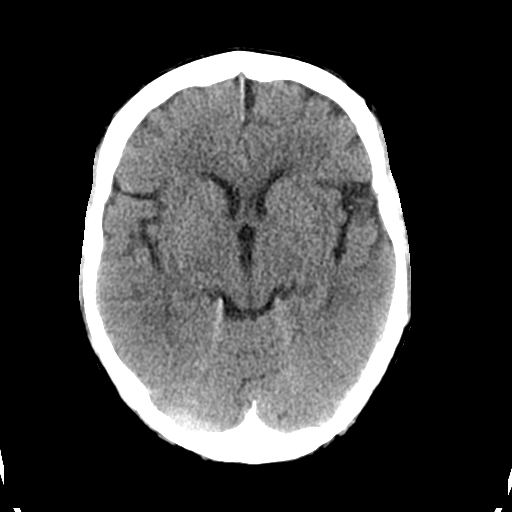
[im 14/28  brain]
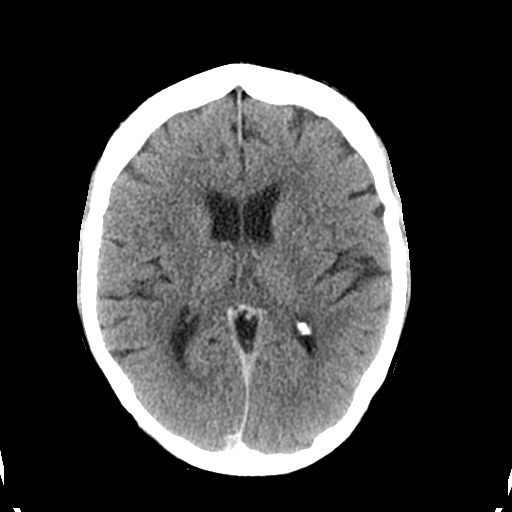
[im 17/28  brain]
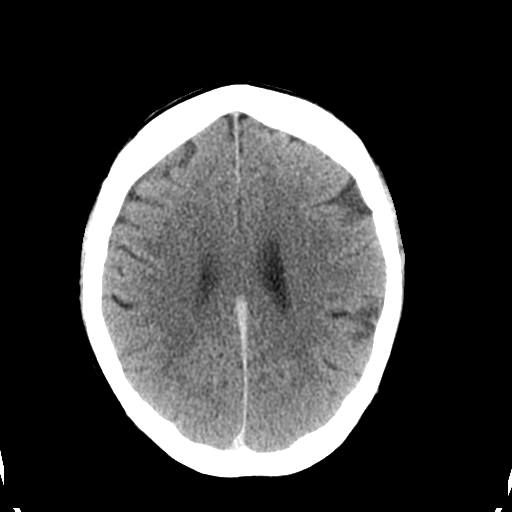
[im 17/28  bone]
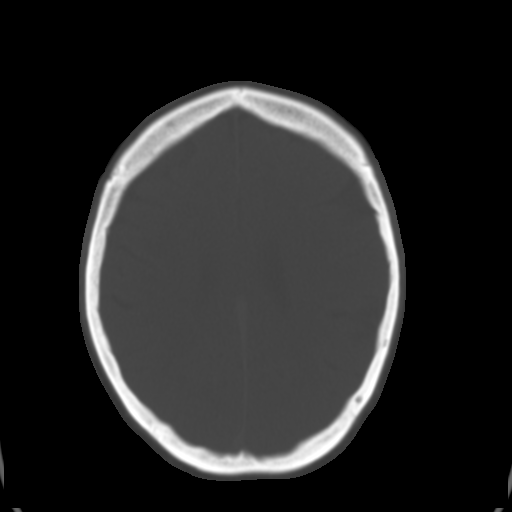
[im 21/28  brain]
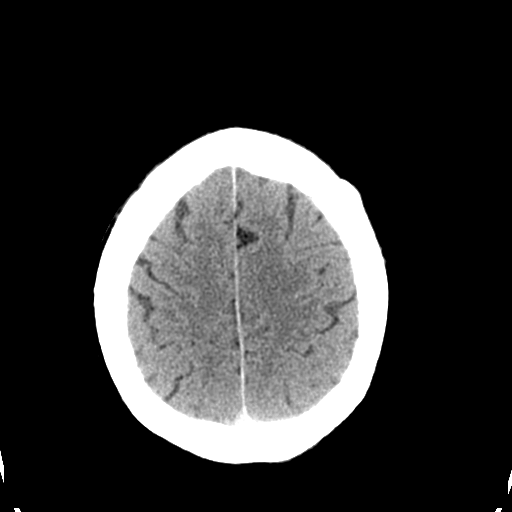
[im 24/28  brain]
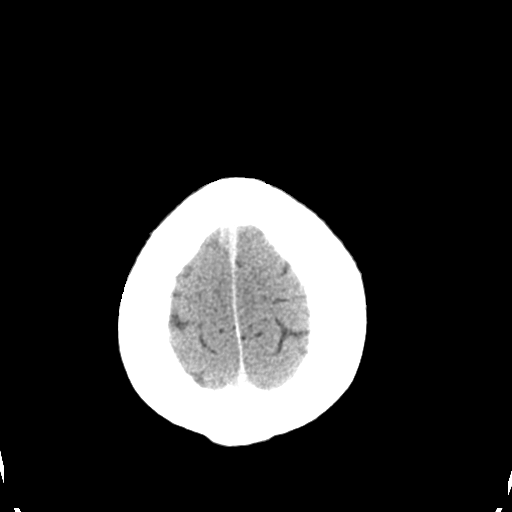

[Series 4: coronal soft tissue · coronal · 0.28mm/px · 3 of 61 slices shown]
[im 21/61  brain]
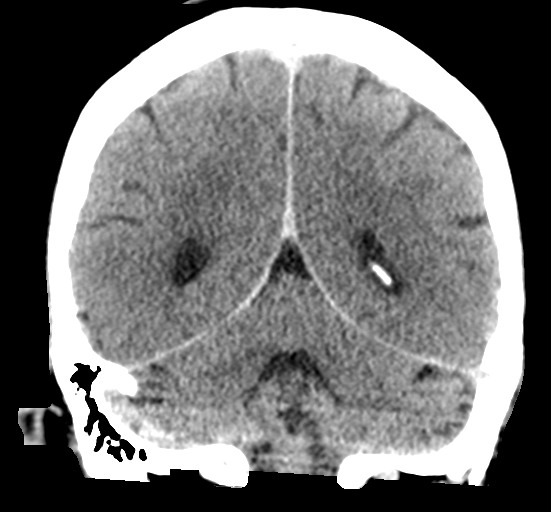
[im 27/61  brain]
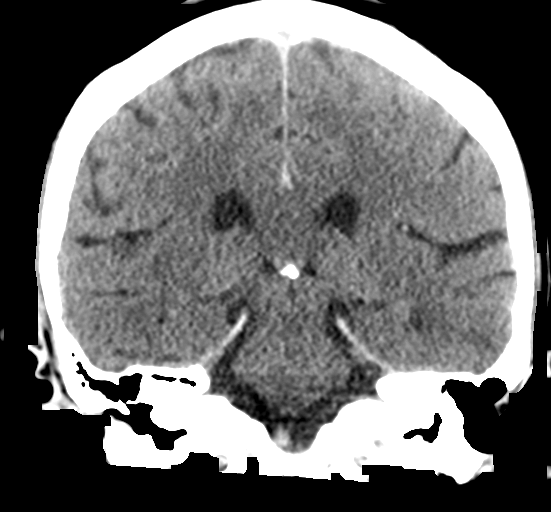
[im 34/61  brain]
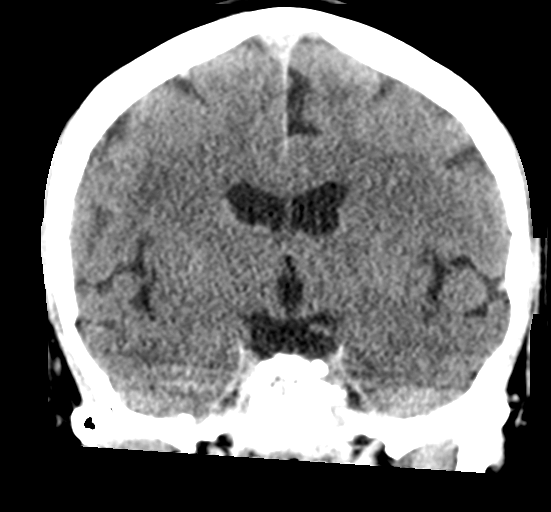

[Series 5: sagittal soft tissue · sagittal · 0.28mm/px · 3 of 51 slices shown]
[im 17/51  brain]
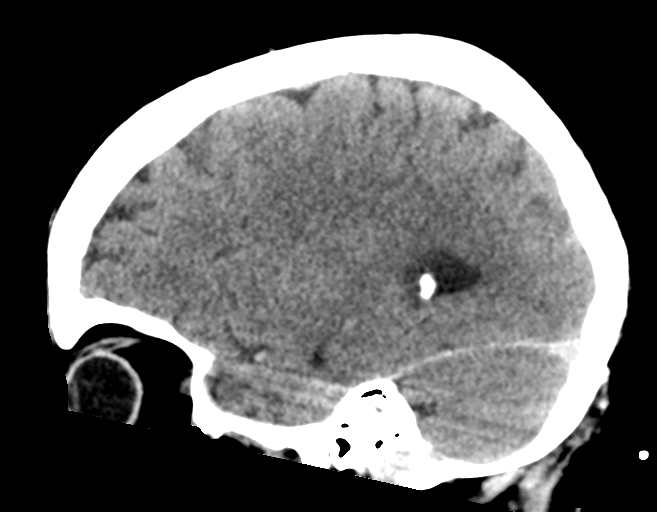
[im 26/51  brain]
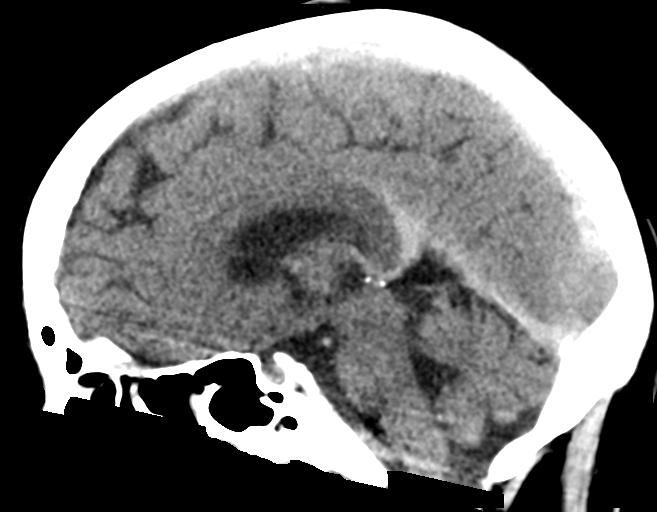
[im 34/51  brain]
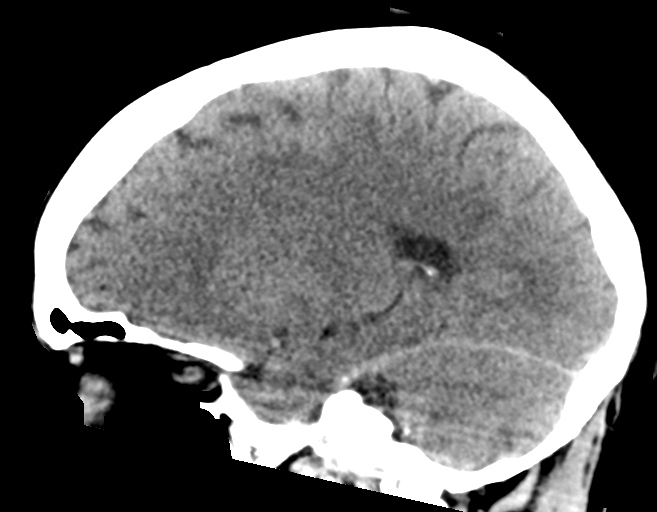

[16 of 47 positions shown; findings below may reference images not displayed]

FINDINGS: Brain: No mass lesion, intraparenchymal hemorrhage or extra-axial
collection. No evidence of acute cortical infarct. Brain parenchyma
and CSF-containing spaces are normal for age.

Vascular: There is contrast material within the venous sinuses,
consistent with the history contrast-enhanced chest CT performed
earlier today. Atherosclerotic calcification of the internal carotid
arteries at the skull base.

Skull: Normal visualized skull base, calvarium and extracranial soft
tissues.

Sinuses/Orbits: No sinus fluid levels or advanced mucosal
thickening. No mastoid effusion. Normal orbits.
IMPRESSION: Normal brain. Retained intravascular contrast material from CTA
chest performed approximately 4 hours earlier.

## 2018-02-17 IMAGING — DX DG CHEST 1V PORT
1 series · 1 of 1 positions shown · non-contrast
Comparison: 05/28/2017

CLINICAL DATA: Respiratory failure.  Ventilator support.

EXAM:
PORTABLE CHEST 1 VIEW

[chest ap]
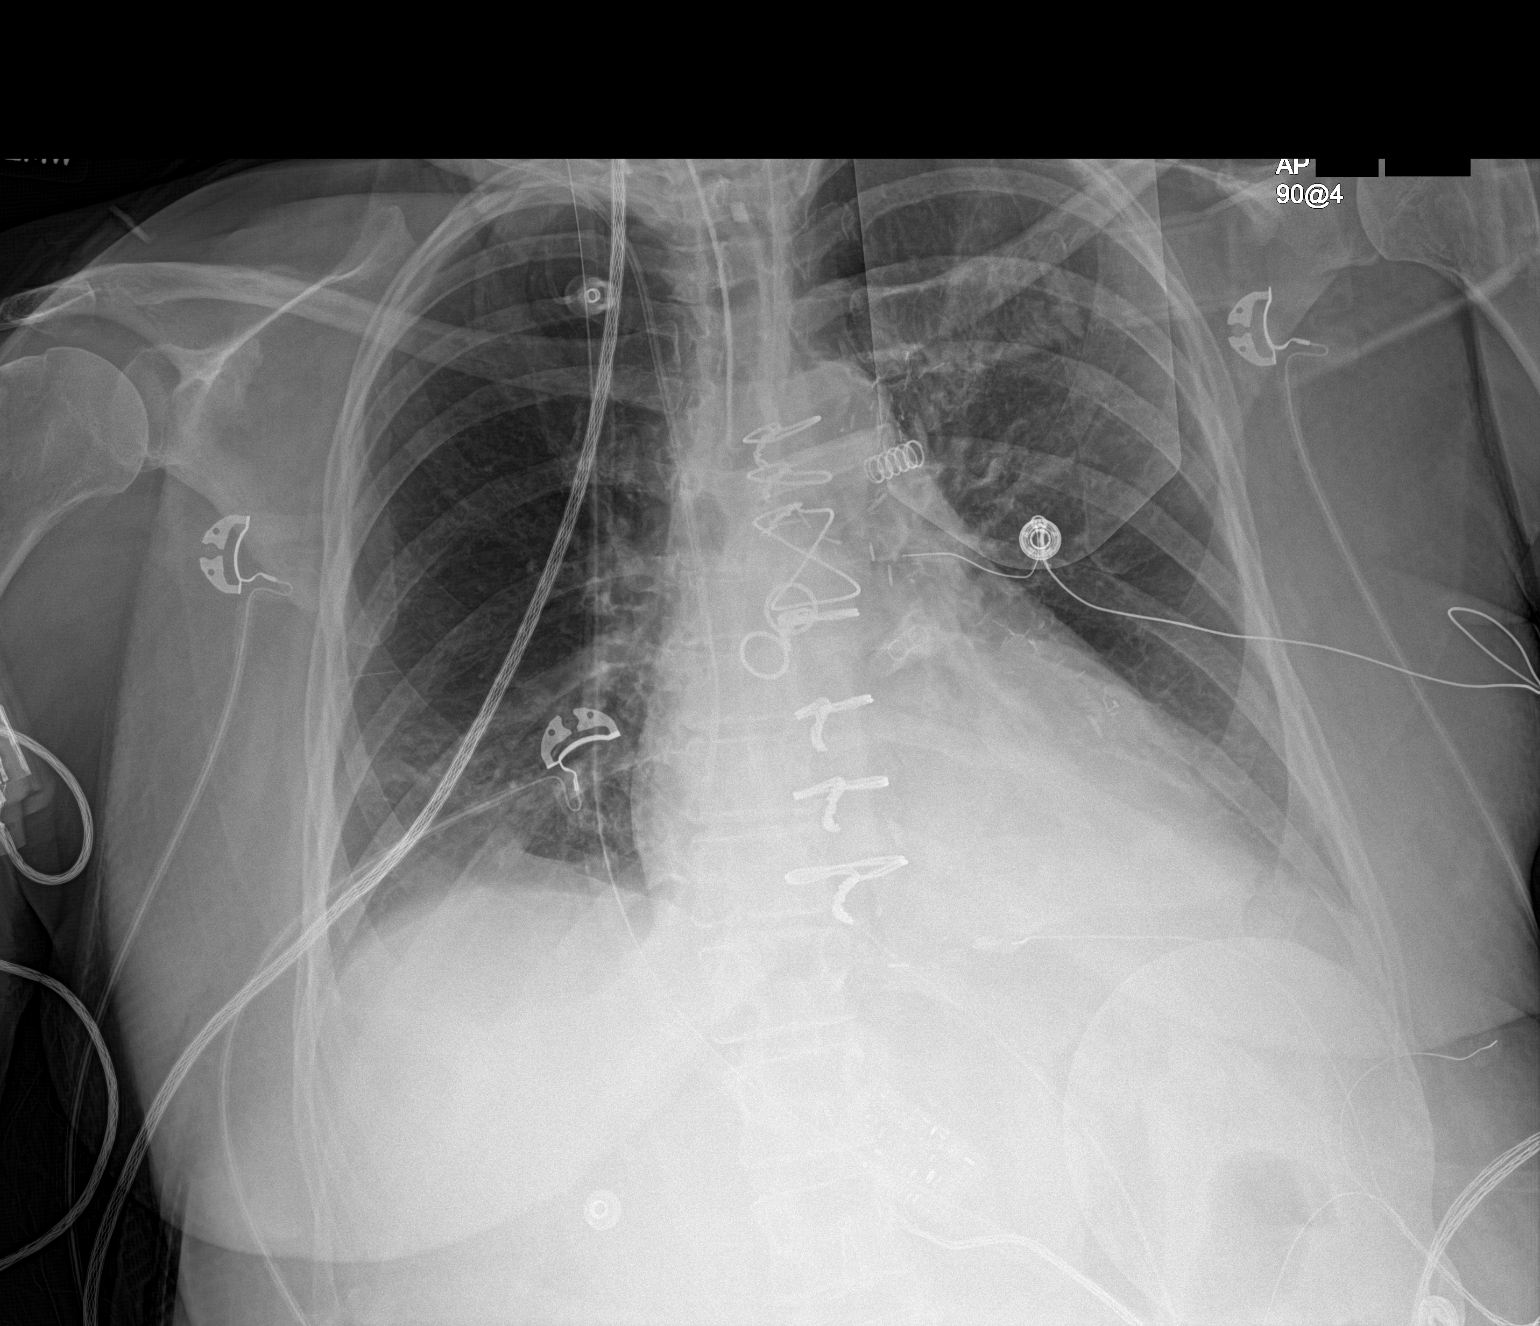

[1 of 1 positions shown; findings below may reference images not displayed]

FINDINGS: Endotracheal tube tip is 2 cm above the carina. Nasogastric tube
enters the abdomen. Right internal jugular central line tip is in
the right atrium. Patchy volume loss at the right base persists.
There is worsening of volume loss in the left lower lobe.
IMPRESSION: Worsened volume loss in the left lower lobe. Central line tip again
noted to be in the right atrium.

## 2018-02-20 IMAGING — DX DG CHEST 1V PORT
1 series · 1 of 1 positions shown · non-contrast
Comparison: Two days ago

CLINICAL DATA: Evaluate for atelectasis

EXAM:
PORTABLE CHEST 1 VIEW

[chest ap]
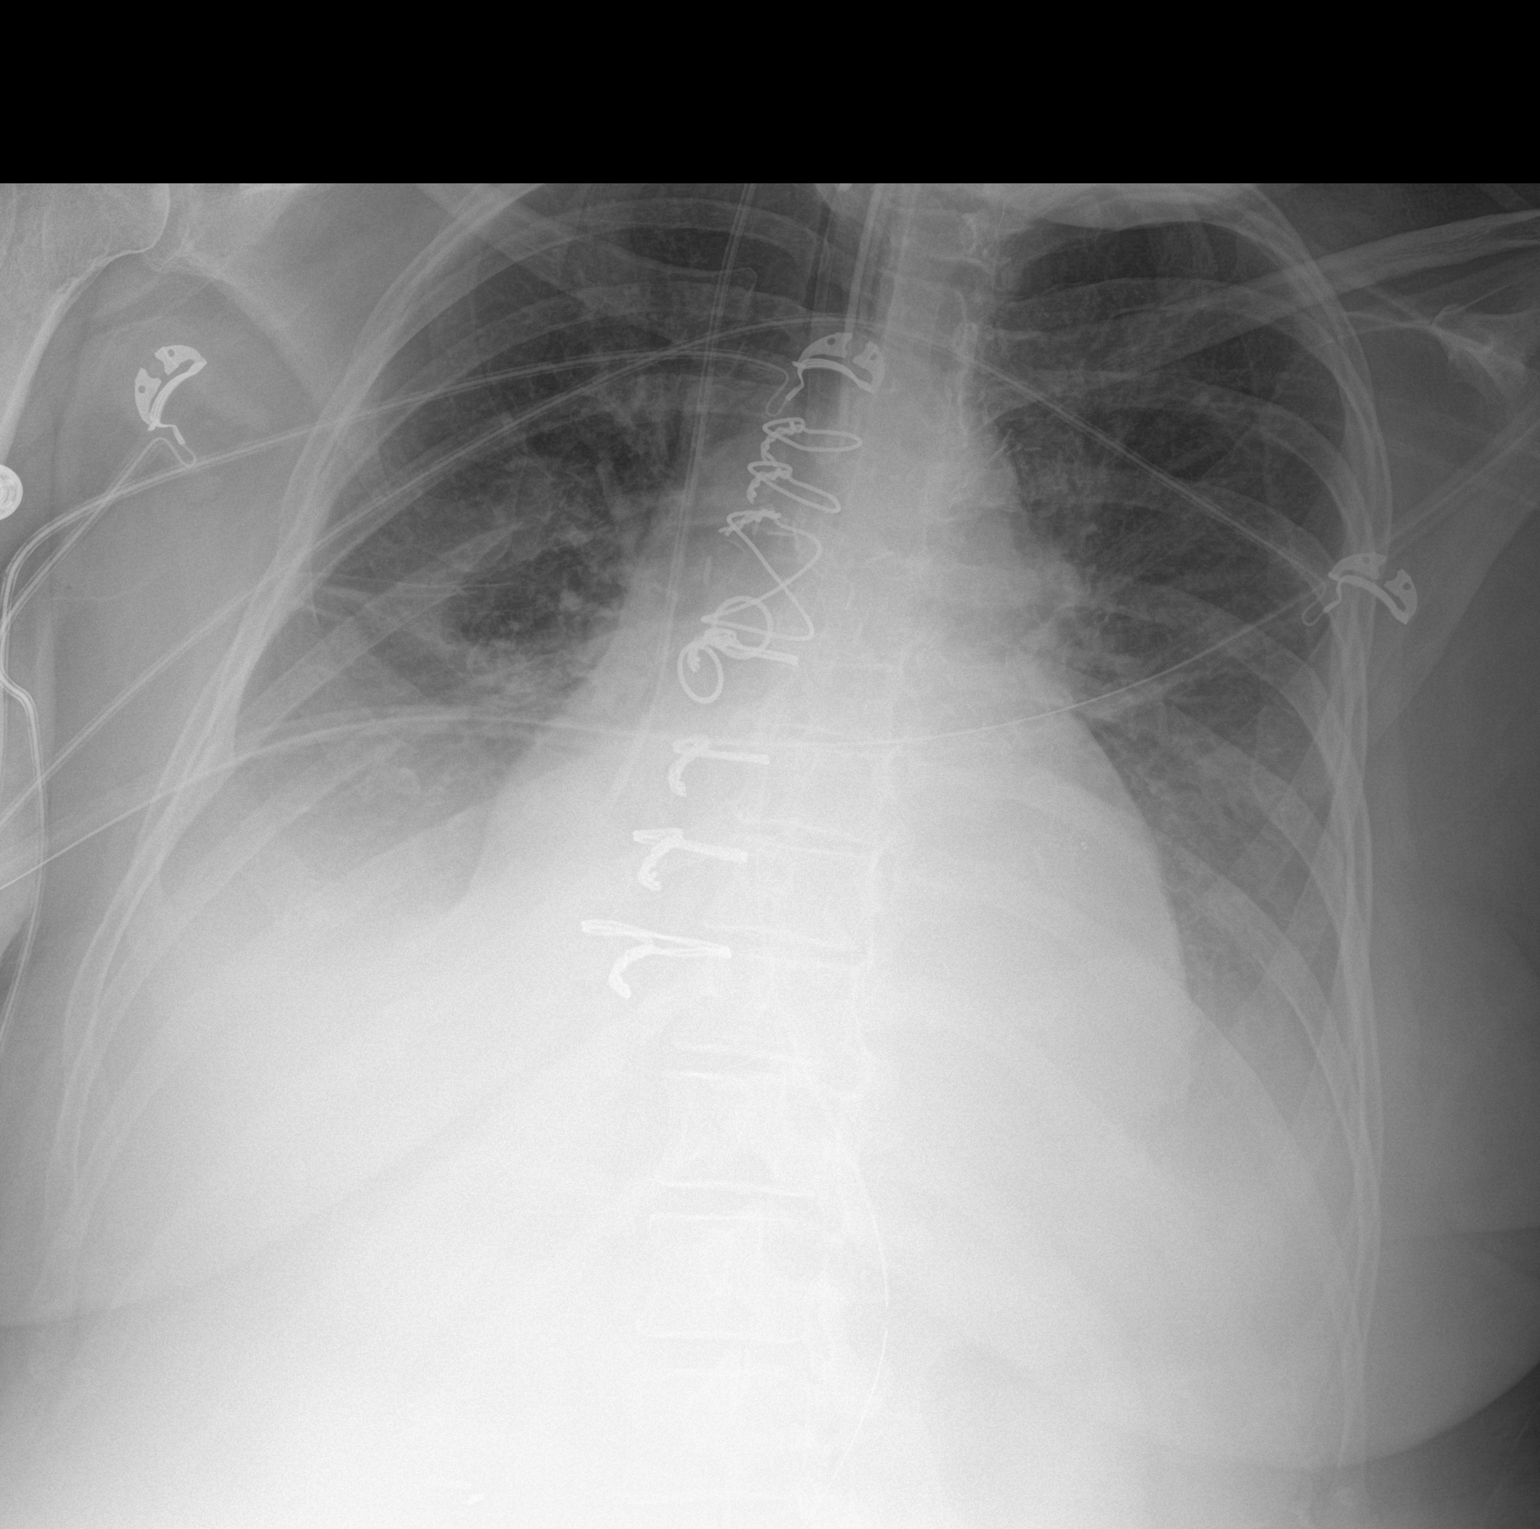

[1 of 1 positions shown; findings below may reference images not displayed]

FINDINGS: Endotracheal tube tip just below the clavicular heads. An orogastric
tube reaches the stomach. Right IJ line with tip at the right
atrium.

Haziness of the lower chest that is progressed, now with non visible
right diaphragm. No visible Kerley lines or air bronchogram. No
pneumothorax.

Stable heart size.  Status post CABG.
IMPRESSION: 1. Stable positioning of tubes and central line.
2. Worsening aeration at the bases, hazy appearance favoring
atelectasis and pleural fluid.

## 2018-02-21 IMAGING — DX DG CHEST 1V PORT
1 series · 1 of 1 positions shown · non-contrast
Comparison: June 01, 2017

CLINICAL DATA: Hypoxia

EXAM:
PORTABLE CHEST 1 VIEW

[chest ap]
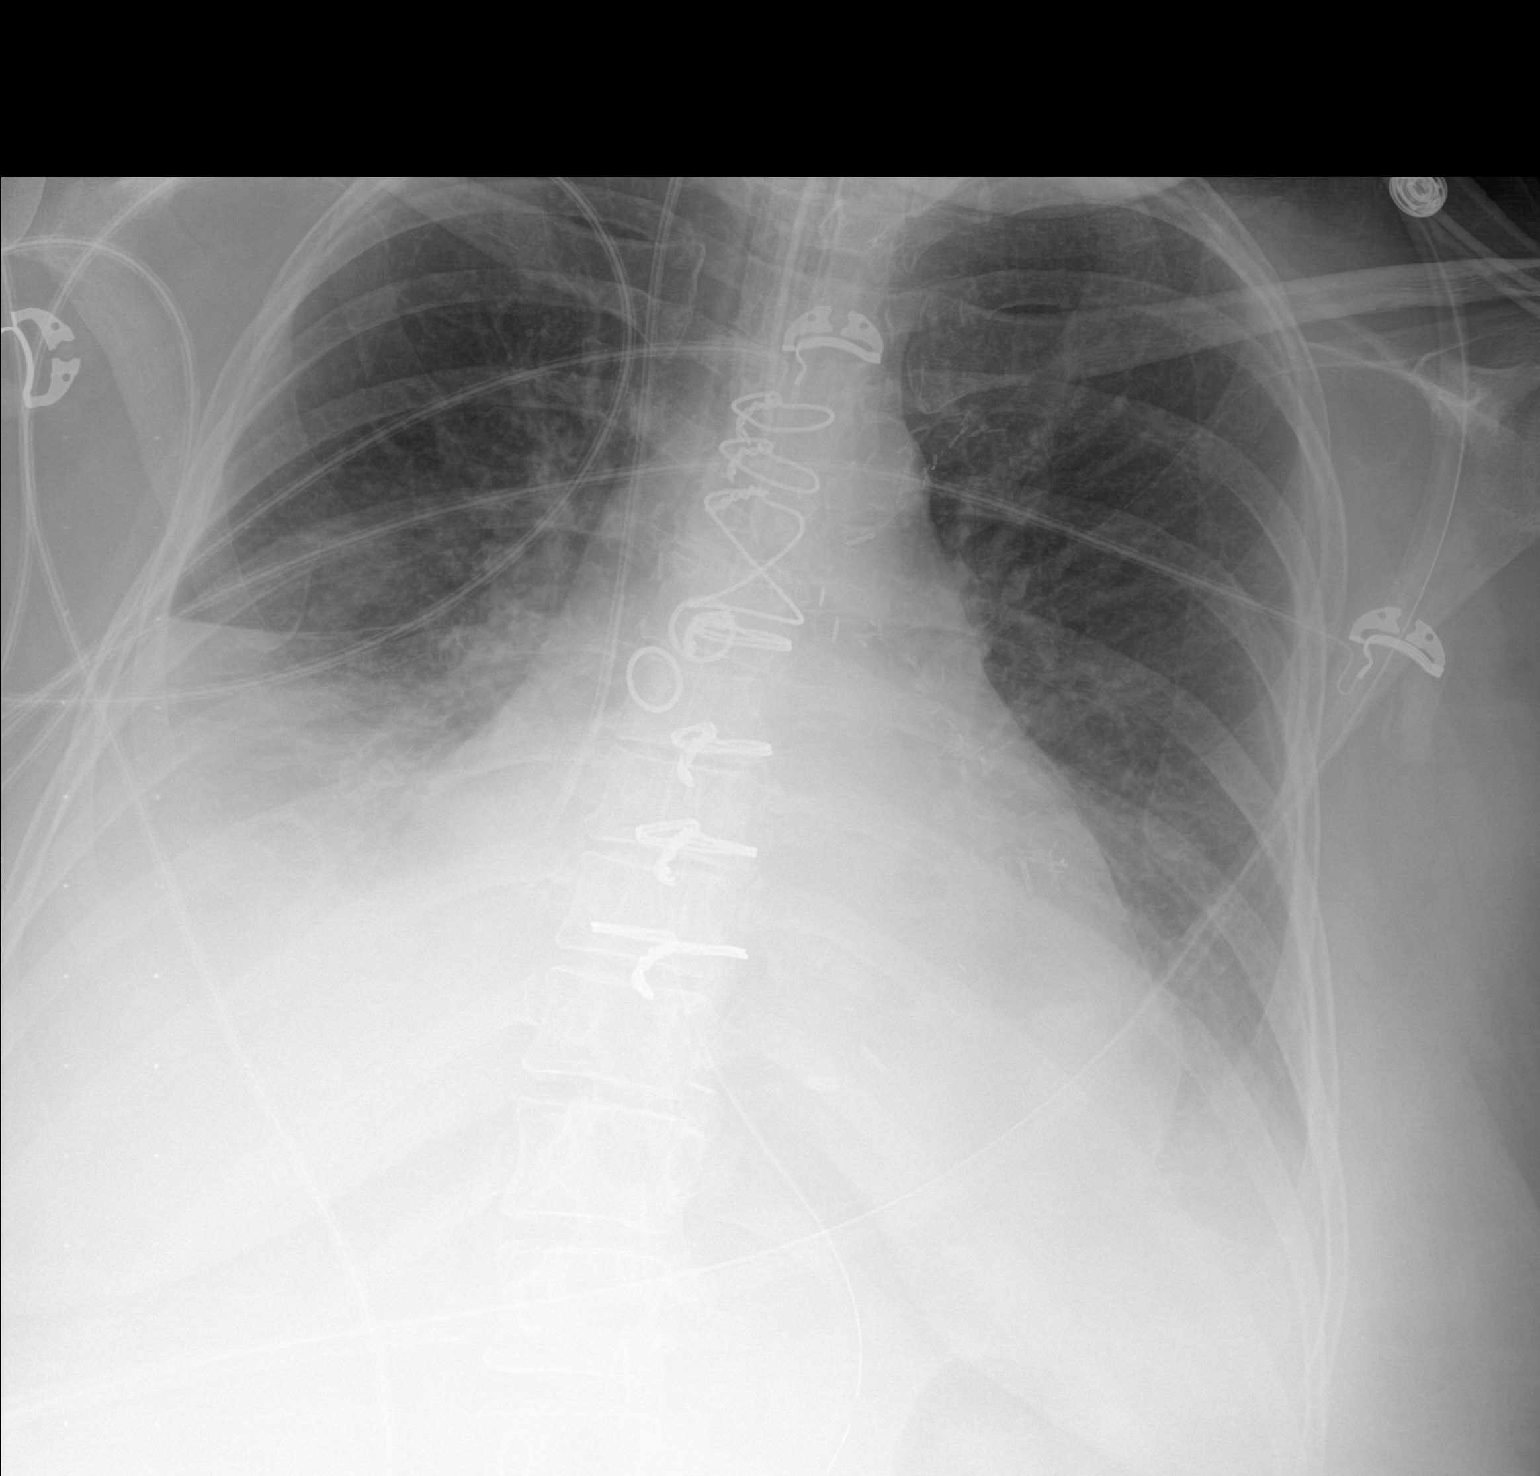

[1 of 1 positions shown; findings below may reference images not displayed]

FINDINGS: Endotracheal tube tip is 2.0 cm above the carina. Nasogastric tube
tip and side port are below the diaphragm. Central catheter tip is
in the right atrium slightly beyond the cavoatrial junction. No
pneumothorax.

There are pleural effusions bilaterally with bibasilar
consolidation. There is cardiomegaly with pulmonary vascularity
within normal limits. There is aortic atherosclerosis. Patient is
status post coronary artery bypass grafting. No bone lesions.
IMPRESSION: Tube and catheter positions as described without pneumothorax. There
is cardiomegaly with bilateral pleural effusions and bibasilar
consolidation. Question alveolar edema versus pneumonia in the
bases. Both entities may exist concurrently. There is aortic
atherosclerosis.

Aortic Atherosclerosis ([id number]).

## 2018-05-14 IMAGING — US US RENAL
1 series · 14 of 25 positions shown · non-contrast
Comparison: CT, 12/10/2011

CLINICAL DATA: Acute kidney failure superimposed on chronic kidney
disease.

EXAM:
RENAL / URINARY TRACT ULTRASOUND COMPLETE

[Series 1: us renal · 0.26mm/px · 14 of 28 slices shown]
[im 1/28]
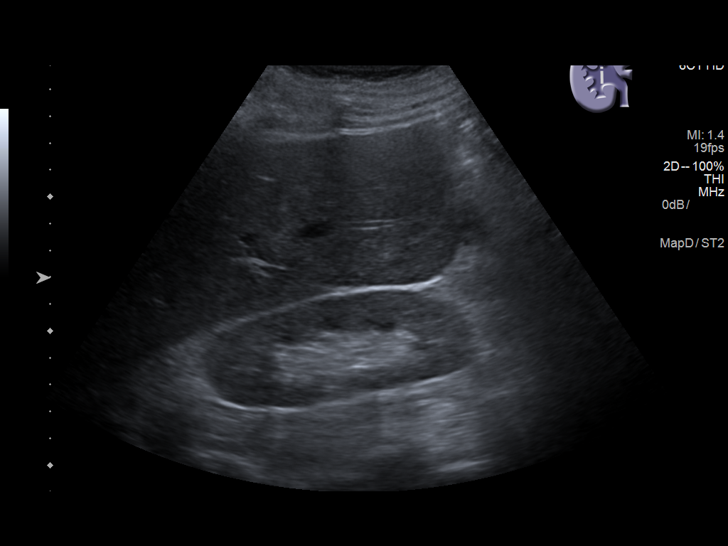
[im 3/28]
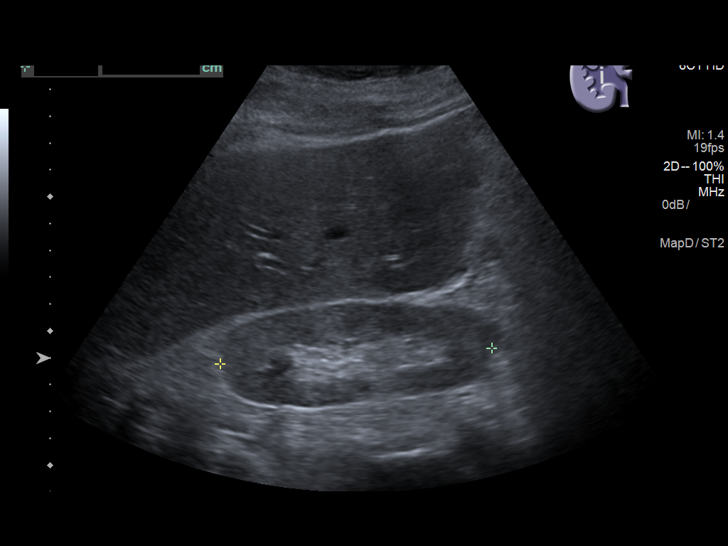
[im 5/28]
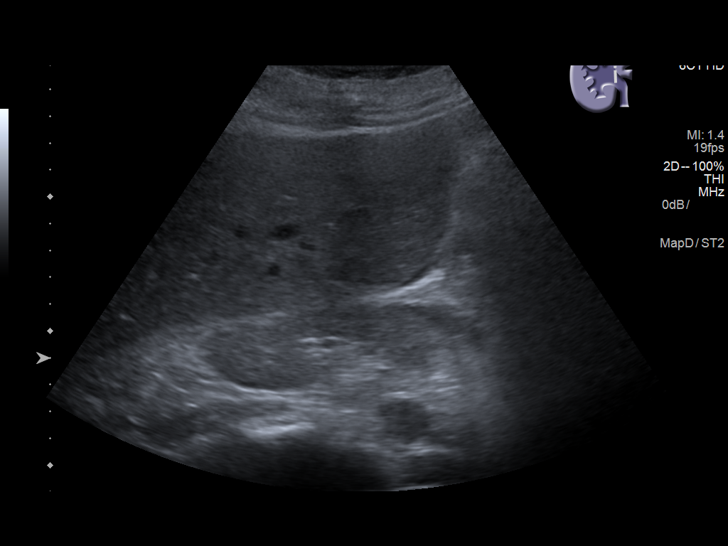
[im 7/28]
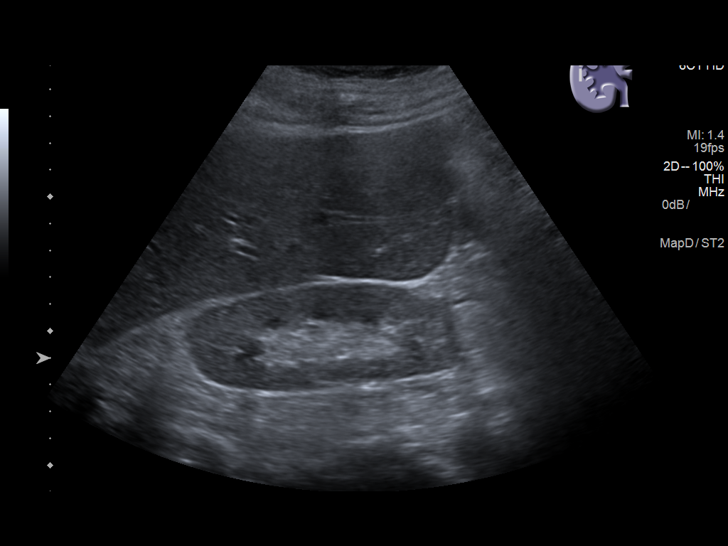
[im 10/28]
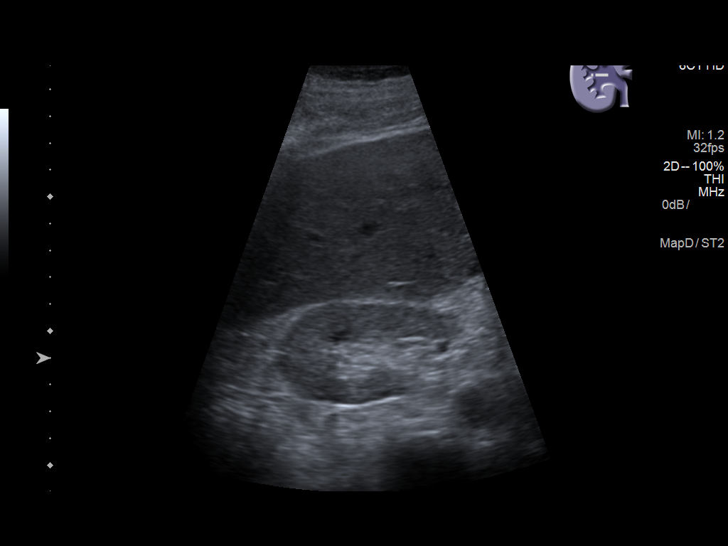
[im 11/28]
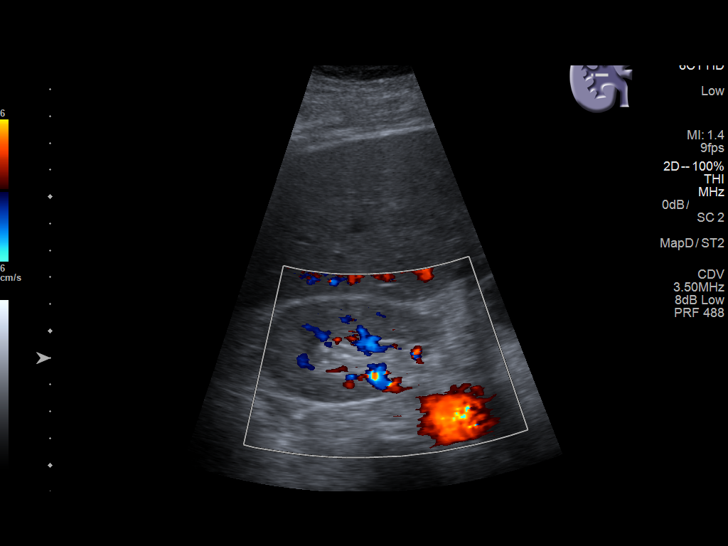
[im 13/28]
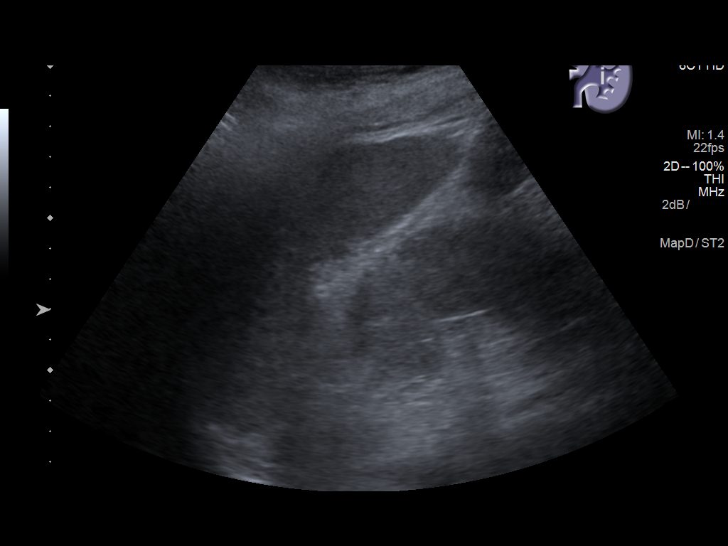
[im 15/28]
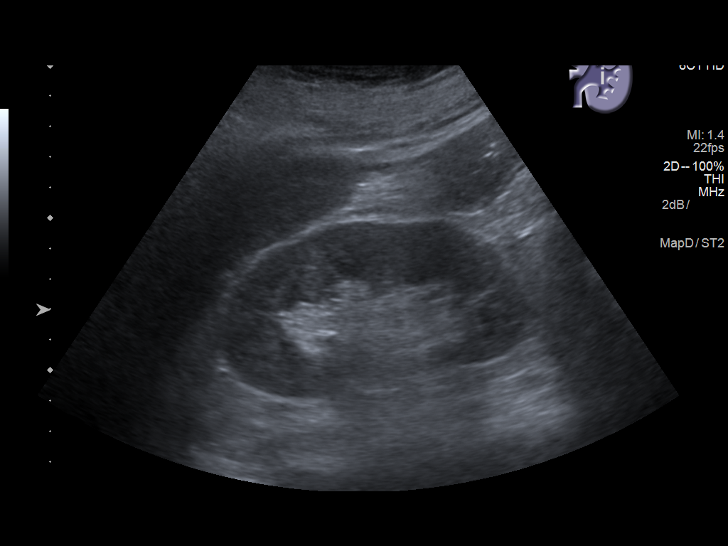
[im 17/28]
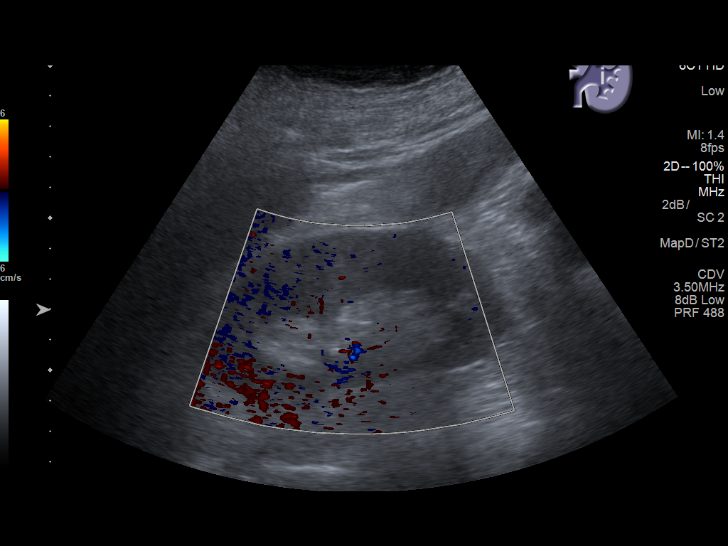
[im 19/28]
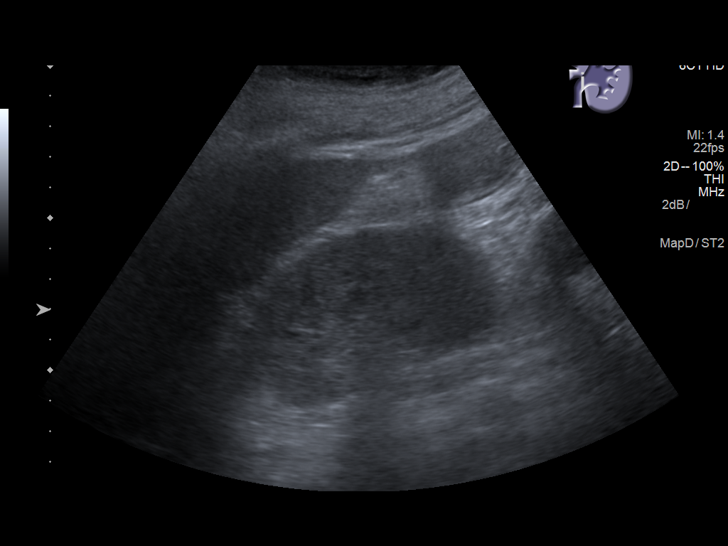
[im 21/28]
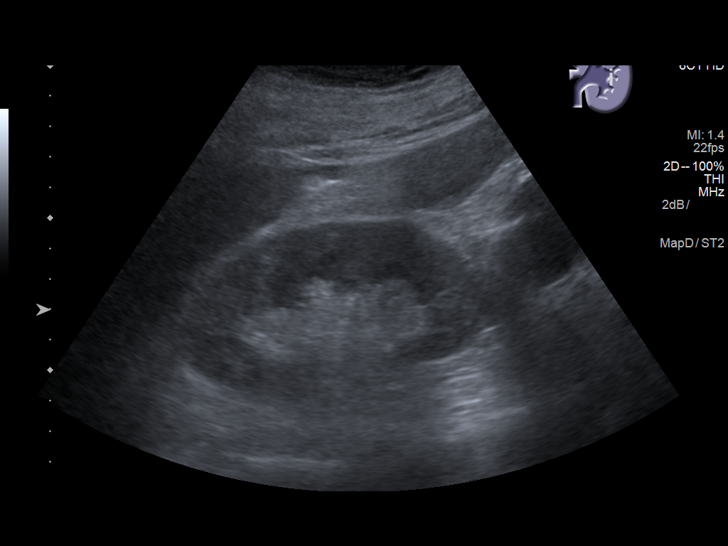
[im 23/28]
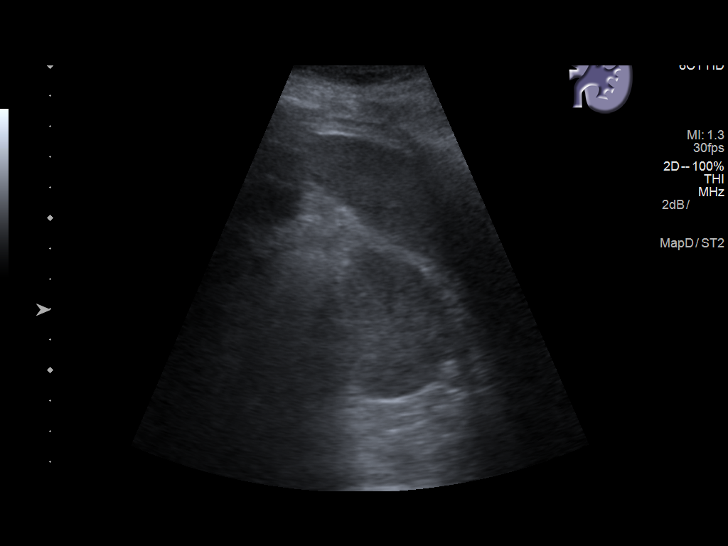
[im 25/28]
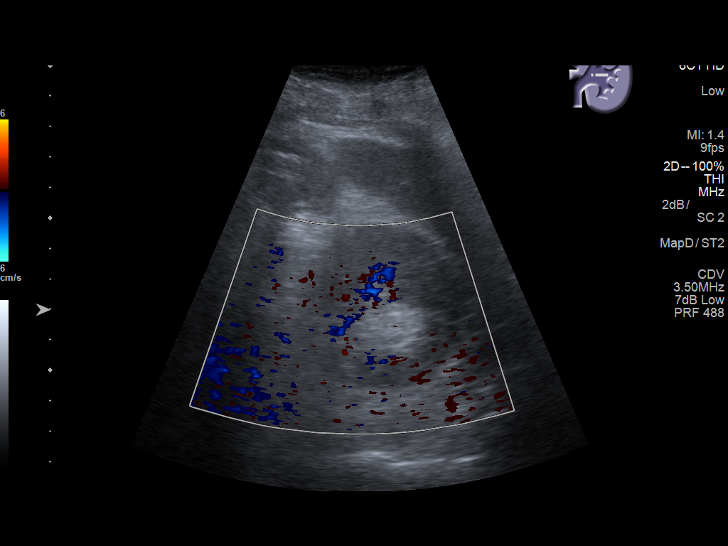
[im 28/28]
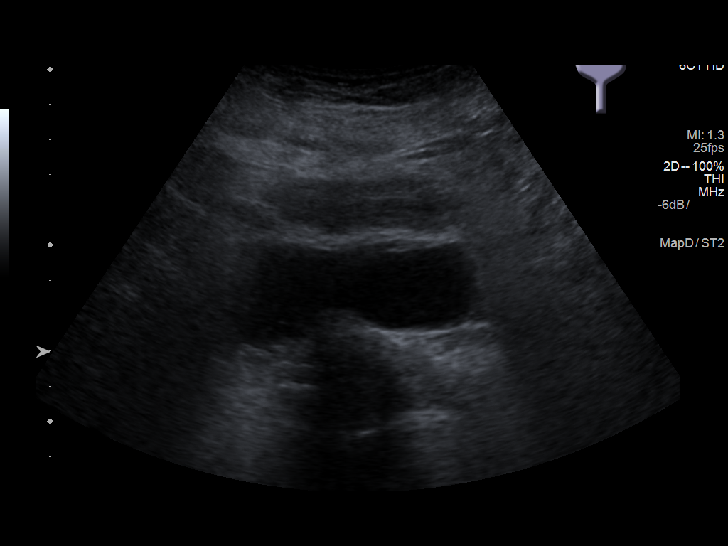

[14 of 25 positions shown; findings below may reference images not displayed]

FINDINGS: Right Kidney:

Length: 10.1 cm. Mild increase in parenchymal echogenicity. No mass
or stone. No hydronephrosis.

Left Kidney:

Length: 10.1 cm. Echogenicity within normal limits. No mass or
hydronephrosis visualized.

Bladder:

Not well distended.  Grossly unremarkable.
IMPRESSION: 1. No acute findings.  No hydronephrosis.  No renal masses.
2. Mild increased right renal parenchymal echogenicity consistent
with medical renal disease. No other abnormality.
